# Patient Record
Sex: Male | Born: 1940 | Race: Black or African American | Hispanic: No | Marital: Married | Smoking: Never smoker
Health system: Southern US, Community
[De-identification: ages and names within clinical notes are randomized; demographics above are authoritative.]

## PROBLEM LIST (undated history)

## (undated) DIAGNOSIS — E039 Hypothyroidism, unspecified: Secondary | ICD-10-CM

## (undated) DIAGNOSIS — M1990 Unspecified osteoarthritis, unspecified site: Secondary | ICD-10-CM

## (undated) DIAGNOSIS — R7303 Prediabetes: Secondary | ICD-10-CM

## (undated) DIAGNOSIS — E785 Hyperlipidemia, unspecified: Secondary | ICD-10-CM

## (undated) DIAGNOSIS — I2510 Atherosclerotic heart disease of native coronary artery without angina pectoris: Secondary | ICD-10-CM

## (undated) HISTORY — PX: BACK SURGERY: SHX140

## (undated) HISTORY — DX: Atherosclerotic heart disease of native coronary artery without angina pectoris: I25.10

## (undated) HISTORY — PX: EYE SURGERY: SHX253

## (undated) HISTORY — DX: Hyperlipidemia, unspecified: E78.5

## (undated) HISTORY — DX: Hypothyroidism, unspecified: E03.9

## (undated) HISTORY — PX: TONSILLECTOMY: SUR1361

---

## 1998-10-17 ENCOUNTER — Ambulatory Visit (HOSPITAL_COMMUNITY): Admission: RE | Admit: 1998-10-17 | Discharge: 1998-10-17 | Payer: Self-pay | Admitting: Internal Medicine

## 2002-02-08 ENCOUNTER — Ambulatory Visit (HOSPITAL_COMMUNITY): Admission: RE | Admit: 2002-02-08 | Discharge: 2002-02-08 | Payer: Self-pay | Admitting: Internal Medicine

## 2002-02-08 ENCOUNTER — Encounter: Payer: Self-pay | Admitting: Internal Medicine

## 2003-09-20 ENCOUNTER — Ambulatory Visit (HOSPITAL_COMMUNITY): Admission: RE | Admit: 2003-09-20 | Discharge: 2003-09-20 | Payer: Self-pay | Admitting: Internal Medicine

## 2005-04-20 ENCOUNTER — Ambulatory Visit (HOSPITAL_COMMUNITY): Admission: RE | Admit: 2005-04-20 | Discharge: 2005-04-20 | Payer: Self-pay | Admitting: Internal Medicine

## 2006-07-02 ENCOUNTER — Emergency Department (HOSPITAL_COMMUNITY): Admission: EM | Admit: 2006-07-02 | Discharge: 2006-07-02 | Payer: Self-pay | Admitting: Emergency Medicine

## 2006-07-22 ENCOUNTER — Encounter (INDEPENDENT_AMBULATORY_CARE_PROVIDER_SITE_OTHER): Payer: Self-pay | Admitting: Cardiology

## 2006-07-22 ENCOUNTER — Ambulatory Visit (HOSPITAL_COMMUNITY): Admission: RE | Admit: 2006-07-22 | Discharge: 2006-07-22 | Payer: Self-pay | Admitting: *Deleted

## 2008-01-06 ENCOUNTER — Emergency Department (HOSPITAL_COMMUNITY): Admission: EM | Admit: 2008-01-06 | Discharge: 2008-01-06 | Payer: Self-pay | Admitting: Emergency Medicine

## 2010-09-12 ENCOUNTER — Ambulatory Visit (HOSPITAL_COMMUNITY): Admission: RE | Admit: 2010-09-12 | Discharge: 2010-09-12 | Payer: Self-pay | Admitting: Internal Medicine

## 2010-09-20 ENCOUNTER — Ambulatory Visit (HOSPITAL_COMMUNITY): Admission: RE | Admit: 2010-09-20 | Discharge: 2010-09-20 | Payer: Self-pay | Admitting: Internal Medicine

## 2010-10-27 ENCOUNTER — Encounter
Admission: RE | Admit: 2010-10-27 | Discharge: 2010-10-27 | Payer: Self-pay | Source: Home / Self Care | Attending: Orthopedic Surgery | Admitting: Orthopedic Surgery

## 2010-11-01 ENCOUNTER — Ambulatory Visit (HOSPITAL_COMMUNITY)
Admission: RE | Admit: 2010-11-01 | Discharge: 2010-11-01 | Payer: Self-pay | Source: Home / Self Care | Attending: Orthopedic Surgery | Admitting: Orthopedic Surgery

## 2010-11-29 ENCOUNTER — Other Ambulatory Visit (HOSPITAL_COMMUNITY): Payer: Self-pay | Admitting: Family Medicine

## 2010-11-29 DIAGNOSIS — R911 Solitary pulmonary nodule: Secondary | ICD-10-CM

## 2010-12-21 ENCOUNTER — Other Ambulatory Visit (HOSPITAL_COMMUNITY): Payer: Self-pay | Admitting: Family Medicine

## 2010-12-21 ENCOUNTER — Ambulatory Visit (HOSPITAL_COMMUNITY): Admission: RE | Admit: 2010-12-21 | Payer: BC Managed Care – PPO | Source: Ambulatory Visit

## 2010-12-21 ENCOUNTER — Ambulatory Visit (HOSPITAL_COMMUNITY)
Admission: RE | Admit: 2010-12-21 | Discharge: 2010-12-21 | Disposition: A | Discharge status: Home / Self Care | Payer: BC Managed Care – PPO | Source: Ambulatory Visit | Attending: Family Medicine | Admitting: Family Medicine

## 2010-12-21 ENCOUNTER — Encounter (HOSPITAL_COMMUNITY): Payer: Self-pay

## 2010-12-21 DIAGNOSIS — Z09 Encounter for follow-up examination after completed treatment for conditions other than malignant neoplasm: Secondary | ICD-10-CM | POA: Insufficient documentation

## 2010-12-21 DIAGNOSIS — K7689 Other specified diseases of liver: Secondary | ICD-10-CM | POA: Insufficient documentation

## 2010-12-21 DIAGNOSIS — R911 Solitary pulmonary nodule: Secondary | ICD-10-CM

## 2010-12-21 DIAGNOSIS — J984 Other disorders of lung: Secondary | ICD-10-CM | POA: Insufficient documentation

## 2010-12-21 DIAGNOSIS — K769 Liver disease, unspecified: Secondary | ICD-10-CM

## 2010-12-21 DIAGNOSIS — Q619 Cystic kidney disease, unspecified: Secondary | ICD-10-CM | POA: Insufficient documentation

## 2010-12-21 MED ORDER — IOHEXOL 300 MG/ML  SOLN
100.0000 mL | Freq: Once | INTRAMUSCULAR | Status: AC | PRN
  Administered 2010-12-21: 100 mL via INTRAVENOUS

## 2011-06-27 ENCOUNTER — Other Ambulatory Visit (HOSPITAL_COMMUNITY): Payer: Self-pay | Admitting: Family Medicine

## 2011-06-27 DIAGNOSIS — R911 Solitary pulmonary nodule: Secondary | ICD-10-CM

## 2011-06-28 ENCOUNTER — Other Ambulatory Visit (HOSPITAL_COMMUNITY): Payer: Medicare Other

## 2011-06-28 ENCOUNTER — Ambulatory Visit (HOSPITAL_COMMUNITY)
Admission: RE | Admit: 2011-06-28 | Discharge: 2011-06-28 | Disposition: A | Discharge status: Home / Self Care | Payer: BC Managed Care – PPO | Source: Ambulatory Visit | Attending: Family Medicine | Admitting: Family Medicine

## 2011-06-28 DIAGNOSIS — J984 Other disorders of lung: Secondary | ICD-10-CM | POA: Insufficient documentation

## 2011-06-28 DIAGNOSIS — R911 Solitary pulmonary nodule: Secondary | ICD-10-CM

## 2011-06-28 DIAGNOSIS — Z09 Encounter for follow-up examination after completed treatment for conditions other than malignant neoplasm: Secondary | ICD-10-CM | POA: Insufficient documentation

## 2011-08-02 LAB — I-STAT 8, (EC8 V) (CONVERTED LAB)
BUN: 11
Bicarbonate: 24.9 — ABNORMAL HIGH
Chloride: 104
Glucose, Bld: 117 — ABNORMAL HIGH
HCT: 49
Hemoglobin: 16.7
Operator id: 234501
Potassium: 4.2
Sodium: 135
TCO2: 26
pCO2, Ven: 42.1 — ABNORMAL LOW
pH, Ven: 7.38 — ABNORMAL HIGH

## 2011-08-02 LAB — POCT I-STAT CREATININE
Creatinine, Ser: 1.3
Operator id: 234501

## 2011-11-28 ENCOUNTER — Ambulatory Visit
Admission: RE | Admit: 2011-11-28 | Discharge: 2011-11-28 | Disposition: A | Discharge status: Home / Self Care | Payer: BC Managed Care – PPO | Source: Ambulatory Visit | Attending: Family Medicine | Admitting: Family Medicine

## 2011-11-28 ENCOUNTER — Other Ambulatory Visit: Payer: Self-pay | Admitting: Family Medicine

## 2011-11-28 MED ORDER — IOHEXOL 300 MG/ML  SOLN
100.0000 mL | Freq: Once | INTRAMUSCULAR | Status: AC | PRN
  Administered 2011-11-28: 100 mL via INTRAVENOUS

## 2012-03-18 ENCOUNTER — Encounter: Payer: Self-pay | Admitting: Pulmonary Disease

## 2012-03-18 ENCOUNTER — Ambulatory Visit (INDEPENDENT_AMBULATORY_CARE_PROVIDER_SITE_OTHER): Payer: Medicare Other | Admitting: Pulmonary Disease

## 2012-03-18 VITALS — BP 112/64 | HR 59 | Temp 98.1°F | Wt 176.6 lb

## 2012-03-18 DIAGNOSIS — R059 Cough, unspecified: Secondary | ICD-10-CM

## 2012-03-18 DIAGNOSIS — R911 Solitary pulmonary nodule: Secondary | ICD-10-CM

## 2012-03-18 DIAGNOSIS — R05 Cough: Secondary | ICD-10-CM | POA: Insufficient documentation

## 2012-03-18 DIAGNOSIS — R054 Cough syncope: Secondary | ICD-10-CM

## 2012-03-18 DIAGNOSIS — R053 Chronic cough: Secondary | ICD-10-CM

## 2012-03-18 DIAGNOSIS — R0982 Postnasal drip: Secondary | ICD-10-CM

## 2012-03-18 DIAGNOSIS — K219 Gastro-esophageal reflux disease without esophagitis: Secondary | ICD-10-CM | POA: Insufficient documentation

## 2012-03-18 MED ORDER — ESOMEPRAZOLE MAGNESIUM 40 MG PO PACK: 40.0000 mg | PACK | Freq: Every day | ORAL | Status: DC

## 2012-03-18 MED ORDER — FLUTICASONE PROPIONATE 50 MCG/ACT NA SUSP: 2.0000 | Freq: Every day | NASAL | Status: DC

## 2012-03-18 MED ORDER — DIPHENHYDRAMINE HCL 50 MG PO CAPS: ORAL_CAPSULE | ORAL | Status: DC

## 2012-03-18 MED ORDER — TRAMADOL HCL 50 MG PO TABS: 50.0000 mg | ORAL_TABLET | Freq: Three times a day (TID) | ORAL | Status: AC | PRN

## 2012-03-18 NOTE — Progress Notes (Signed)
Chief Complaint  Patient presents with  . Advice Only    Pt c/o chronic cough x sept 2012. Sometimes he coughs so much he passes out.    History of Present Illness: Stephen Garcia is a 71 y.o. male for evaluation of chronic cough.  He has noticed intermittent trouble with a cough for some time.  Things got worse when he had a cold in September 2012.  His cough has become more frequent since.  He can get into coughing spells, and then pass out for about 10 seconds.  These have occurred several times in the past few months.  He has sinus congestion and a runny nose.  He has post-nasal drip.  He denies sinus pressure or headaches.  He will occasionally feel congestion and popping in his ears.  He has been using vicks nasal spray and this helps.  He has noticed that these symptoms get worse around fresh cut grass, and that he will get hoarse when this happens.  He has a constant scratchy, tickle feeling in his throat.  He does not usually bring up sputum.  He does not have trouble swallowing.  His cough will get worse if he laughs, talks for long stretches, or takes deep breaths.  There is no prior history of asthma.  He gets occasional wheeze, but this comes from his throat.  He denies chest tightness or dyspnea with exertion.  He was tried on advair, but felt this made his symptoms worse.  He does get episodes of heartburn, depending on which types of food he eats.  He has notice his cough is worse when he lays flat.  He was given a sample of nexium, and felt that his cough was improved when he took this.  He denies gland swelling, or skin rash.  He tried smoking for a brief period in his 22's.  He is from [place], but has lived in [place] since 1963.  He worked for [employer] system in [place], and retired in 2011.  He does not have any recent travel or sick exposures.  He does not have any animal exposures.  There is no history of pneumonia, or exposure to tuberculosis.  Chest  xray report from November 11, 2011 showed no acute cardiopulmonary process.  He has been followed with serial CT chest since 2011 for pleural based left lower lobe nodule.  This has been stable, and there was no other lung pathology noted.   Past Medical History  Diagnosis Date  . Hyperlipidemia   . Hypothyroidism   . Glaucoma     Past Surgical History  Procedure Date  . Back surgery   . Tonsillectomy     Current Outpatient Prescriptions on File Prior to Visit  Medication Sig Dispense Refill  . Atorvastatin Calcium (LIPITOR PO) Take 1 tablet by mouth daily.      . Levothyroxine Sodium (SYNTHROID PO) Take 1 tablet by mouth daily.        No Known Allergies  family history includes Asthma in his sister and Heart disease in his mother.   reports that he has never smoked. He does not have any smokeless tobacco history on file. He reports that he does not drink alcohol or use illicit drugs.  Review of Systems  Constitutional: Negative for fever and appetite change.  HENT: Negative for ear pain, congestion, sore throat, sneezing, trouble swallowing and dental problem.   Respiratory: Positive for cough. Negative for shortness of breath.   Cardiovascular: Negative  for chest pain, palpitations and leg swelling.  Gastrointestinal: Negative for abdominal pain.  Musculoskeletal: Positive for arthralgias. Negative for joint swelling.  Skin: Negative for color change and rash.  Neurological: Positive for headaches.  Psychiatric/Behavioral: Negative for dysphoric mood. The patient is not nervous/anxious.     Physical Exam: BP 112/64  Pulse 59  Temp(Src) 98.1 F (36.7 C) (Oral)  Wt 176 lb 9.6 oz (80.105 kg)  SpO2 98% There is no height on file to calculate BMI.  General - Obese HEENT - PERRLA, EOMI, wears glasses, no sinus tenderness, clear nasal discharge, MP 3, mild erythema of posterior pharynx, no oral exudate, no LAN, TM clear Cardiac - s1s2 regular, no murmur Chest - good  air entry, no wheeze/rales/dullness Abdomen - soft, non-tender, no organomegaly Extremities - no e/c/c Neurologic - normal strength, CN intact Skin - no rashes Psychiatric - normal mood, behavior  Assessment/Plan:  Outpatient Encounter Prescriptions as of 03/18/2012  Medication Sig Dispense Refill  . Atorvastatin Calcium (LIPITOR PO) Take 1 tablet by mouth daily.      . bimatoprost (LUMIGAN) 0.03 % ophthalmic solution Place 1 drop into both eyes daily.      . dorzolamide-timolol (COSOPT) 22.3-6.8 MG/ML ophthalmic solution Place 1 drop into both eyes 2 (two) times daily.      . Levothyroxine Sodium (SYNTHROID PO) Take 1 tablet by mouth daily.        Keigan Girten Pager:  [phone] 03/18/2012, 4:25 PM

## 2012-03-18 NOTE — Assessment & Plan Note (Signed)
He reports improvement in his cough after trial of nexium.  Will refill this.  Explained proper timing for use of nexium.  He may need further evaluation by GI.

## 2012-03-18 NOTE — Assessment & Plan Note (Signed)
He has pleural based left lower lobe nodule first detected in 2011.  This has been stable on radiographic follow up through January 2013.

## 2012-03-18 NOTE — Assessment & Plan Note (Signed)
Will have him use nasal irrigation and flonase on a regular basis.  He can use benadryl 50 mg nightly for one week, then as needed.  Depending on his response he may need further evaluation by ENT.

## 2012-03-18 NOTE — Patient Instructions (Signed)
Nasal irrigation (saline nasal spray) once daily>>use before using flonase Flonase two sprays each nostril daily Benadryl 50 mg pill>>take one pill before bedtime for one week, then as needed for sinus congestion or allergies Salt water gargle once or twice per day Sip water when you have urge to cough Use sugarless candy to keep your mouth moist Tramadol 50 mg three times per day as needed to suppress cough Nexium 40 mg pill>>take 30 minutes before first meal of the day Will schedule breathing test (PFT) Follow up in 3 to 4 weeks

## 2012-03-18 NOTE — Progress Notes (Deleted)
  Subjective:    Patient ID: Stephen Garcia, male    DOB: 1941-09-20, 71 y.o.   MRN: [id number]  HPI    Review of Systems  Constitutional: Negative for fever and appetite change.  HENT: Negative for ear pain, congestion, sore throat, sneezing, trouble swallowing and dental problem.   Respiratory: Positive for cough. Negative for shortness of breath.   Cardiovascular: Negative for chest pain, palpitations and leg swelling.  Gastrointestinal: Negative for abdominal pain.  Musculoskeletal: Positive for arthralgias. Negative for joint swelling.  Skin: Negative for color change and rash.  Neurological: Positive for headaches.  Psychiatric/Behavioral: Negative for dysphoric mood. The patient is not nervous/anxious.        Objective:   Physical Exam        Assessment & Plan:

## 2012-03-18 NOTE — Assessment & Plan Note (Signed)
He has cough present for several months. There is no history of smoking, and he has essentially normal chest imaging studies.  I have explained that in this scenario chronic cough is most often related to post-nasal drip, asthma, and/or reflux.

## 2012-03-18 NOTE — Assessment & Plan Note (Addendum)
Will arrange for pulmonary function test to further assess for asthma.  However, clinical suspicion for asthma is low.  Will therefore defer starting inhaler therapy for now.  He can use tramadol 50 mg as needed to help suppress his cough.

## 2012-03-19 ENCOUNTER — Telehealth: Payer: Self-pay | Admitting: Pulmonary Disease

## 2012-03-19 NOTE — Telephone Encounter (Signed)
Spoke with pt. He states needs PA for nexium 40 mg daily. Called [facility] and requested that they fax PA request to [phone].  Will await fax

## 2012-03-23 NOTE — Telephone Encounter (Signed)
Pt called back.  Needs nexium.  Has not heard back from [place] yet. Stephen Garcia

## 2012-03-23 NOTE — Telephone Encounter (Signed)
I spoke with [facility] and the insurance is denying the nexium. Pt must first try and fail omeprazole and protonix. Please advise Dr. Craige Cotta, thanks

## 2012-03-24 MED ORDER — OMEPRAZOLE 40 MG PO CPDR: 40.0000 mg | DELAYED_RELEASE_CAPSULE | Freq: Every day | ORAL | Status: DC

## 2012-03-24 NOTE — Telephone Encounter (Signed)
Okay to send script for omeprazole 40 mg daily.  Dispense 30 pills with 3 refills.

## 2012-03-24 NOTE — Telephone Encounter (Signed)
lmomtcb x1--rx has been sent for omperazole 40 mg

## 2012-03-25 NOTE — Telephone Encounter (Signed)
Called, spoke with pt.  I informed him of below.  He verbalized understanding of this and stated he picked this up yesterday.  Advised to call back if he has problems with this need medication.  He verbalized understanding.

## 2012-04-15 ENCOUNTER — Ambulatory Visit (INDEPENDENT_AMBULATORY_CARE_PROVIDER_SITE_OTHER): Payer: Medicare Other | Admitting: Pulmonary Disease

## 2012-04-15 ENCOUNTER — Ambulatory Visit (INDEPENDENT_AMBULATORY_CARE_PROVIDER_SITE_OTHER): Payer: BC Managed Care – PPO | Admitting: Pulmonary Disease

## 2012-04-15 ENCOUNTER — Encounter: Payer: Self-pay | Admitting: Pulmonary Disease

## 2012-04-15 VITALS — BP 110/70 | HR 63 | Temp 97.5°F | Ht 66.0 in | Wt 175.0 lb

## 2012-04-15 DIAGNOSIS — K219 Gastro-esophageal reflux disease without esophagitis: Secondary | ICD-10-CM

## 2012-04-15 DIAGNOSIS — R0982 Postnasal drip: Secondary | ICD-10-CM

## 2012-04-15 DIAGNOSIS — R05 Cough: Secondary | ICD-10-CM

## 2012-04-15 LAB — PULMONARY FUNCTION TEST

## 2012-04-15 NOTE — Progress Notes (Signed)
PFT done today. 

## 2012-04-15 NOTE — Assessment & Plan Note (Signed)
He is to resume flonase, and continue nasal irrigation.  He can use prn benadryl at night.

## 2012-04-15 NOTE — Patient Instructions (Addendum)
Flonase two spray daily Nasal irrigation (saline nasal spray) daily Nexium 40 mg pill daily 30 minutes before first meal of the day Can use benadryl 50 mg at night as needed for allergies or sinus congestion Follow up in 2 months

## 2012-04-15 NOTE — Assessment & Plan Note (Signed)
He is to continue nexium. 

## 2012-04-15 NOTE — Assessment & Plan Note (Signed)
No further recurrent since last visit.

## 2012-04-15 NOTE — Progress Notes (Signed)
Chief Complaint  Patient presents with  . Follow-up    w/ pft. Pt states his breathing is okay--cough is getting better but still has occasional coughing spells, some wheezing and chest tx.     History of Present Illness: Stephen Garcia is a 71 y.o. male never smoker for evaluation of chronic cough, post-nasal drip, and reflux.  His cough was doing better until 3 days ago.  He is getting some hoarseness.  He is not sure if tramadol helped much.   He was not sure if flonase was working.  He got a new script yesterday.  Nasal irrigation and nexium have helped.  He has not had syncope episode since last visit.   Past Medical History  Diagnosis Date  . Hyperlipidemia   . Hypothyroidism   . Glaucoma     Past Surgical History  Procedure Date  . Back surgery   . Tonsillectomy    No Known Allergies   Physical Exam: Filed Vitals:   04/15/12 1051 04/15/12 1052  BP:  110/70  Pulse:  63  Temp: 97.5 F (36.4 C)   TempSrc: Oral   Height: 5\' 6"  (1.676 m)   Weight: 175 lb (79.379 kg)   SpO2:  98%  ,  Current Encounter SPO2  04/15/12 1052 98%  03/18/12 1615 98%   Wt Readings from Last 3 Encounters:  04/15/12 175 lb (79.379 kg)  03/18/12 176 lb 9.6 oz (80.105 kg)   Body mass index is 28.25 kg/(m^2).  General - No distress ENT - Ears normal. Throat and pharynx normal. Neck supple. No adenopathy or masses in the neck or supraclavicular regions. Nasal septum midline, no deformities, nares patent, normal mucosa without swelling, no polyps, no bleeding. Paranasal sinuses are normal. No tenderness to the maxillary, frontal, ethmoids or mastoids. Cardiac - S1 and S2 normal, no murmurs, clicks, gallops or rubs. Regular rate and rhythm.  No edema or JVD. Chest - Chest is clear, no wheezing or rales. Normal symmetric air entry throughout both lung fields. No chest wall deformities or tenderness. Back - Cervical, thoracic and lumbar spine exam is normal without tenderness, masses or  kyphoscoliosis. Full range of motion without pain is noted. Abd - soft without tenderness, guarding, mass, rebound or organomegaly. Bowel sounds are normal. No CVA tenderness noted. Ext - good pulses, motor strength and sensation. Neuro - Cranial nerves are normal. PERLA. EOM's intact. Skin - no discernible active dermatitis, erythema, urticaria or inflammatory process. Psych - normal mood, and behavior.  PFT 04/15/12>>FEV1 2.29(91%), FEV1% 81, TLC 4.59(83%), DLCO 65%, no BD.  Assessment/Plan:  Coralyn Helling, MD [facility] Pager:  [phone] 04/15/2012, 11:20 AM  Patient Instructions  Flonase two spray daily Nasal irrigation (saline nasal spray) daily Nexium 40 mg pill daily 30 minutes before first meal of the day Can use benadryl 50 mg at night as needed for allergies or sinus congestion Follow up in 2 months

## 2012-04-15 NOTE — Assessment & Plan Note (Signed)
Likely from post nasal drip, and reflux.

## 2012-06-22 ENCOUNTER — Ambulatory Visit: Payer: BC Managed Care – PPO | Admitting: Pulmonary Disease

## 2012-10-20 ENCOUNTER — Other Ambulatory Visit: Payer: Self-pay | Admitting: Family Medicine

## 2012-10-20 DIAGNOSIS — R911 Solitary pulmonary nodule: Secondary | ICD-10-CM

## 2012-10-27 ENCOUNTER — Ambulatory Visit
Admission: RE | Admit: 2012-10-27 | Discharge: 2012-10-27 | Disposition: A | Discharge status: Home / Self Care | Payer: BC Managed Care – PPO | Source: Ambulatory Visit | Attending: Family Medicine | Admitting: Family Medicine

## 2012-10-27 DIAGNOSIS — R911 Solitary pulmonary nodule: Secondary | ICD-10-CM

## 2013-05-20 ENCOUNTER — Telehealth: Payer: Self-pay | Admitting: Endocrinology

## 2013-05-20 NOTE — Telephone Encounter (Signed)
Spoke with pt regarding his medical records release, he will call to schedule appt at a later date.

## 2013-09-08 ENCOUNTER — Other Ambulatory Visit: Payer: Self-pay | Admitting: *Deleted

## 2013-09-10 ENCOUNTER — Encounter: Payer: Self-pay | Admitting: Endocrinology

## 2013-09-10 ENCOUNTER — Ambulatory Visit (INDEPENDENT_AMBULATORY_CARE_PROVIDER_SITE_OTHER): Payer: Medicare Other | Admitting: Endocrinology

## 2013-09-10 ENCOUNTER — Other Ambulatory Visit: Payer: Self-pay | Admitting: *Deleted

## 2013-09-10 VITALS — BP 126/72 | HR 77 | Temp 98.6°F | Resp 12 | Ht 65.0 in | Wt 189.0 lb

## 2013-09-10 DIAGNOSIS — E039 Hypothyroidism, unspecified: Secondary | ICD-10-CM

## 2013-09-10 DIAGNOSIS — M810 Age-related osteoporosis without current pathological fracture: Secondary | ICD-10-CM

## 2013-09-10 NOTE — Progress Notes (Signed)
Patient ID: Stephen Garcia, male   DOB: 1941/10/17, 72 y.o.   MRN: [id number]  Reason for Appointment:  Hypothyroidism and osteoporosis, followup visit    History of Present Illness:   The hypothyroidism was first diagnosed  several years ago Unclear what his initial symptoms at diagnosis were Over the last several years he has been requiring lower doses of thyroxine supplementation and his TSH has been persistently low until 5/14 when it was 0.63 Complaints are reported by the patient now are none, no unusual fatigue or cold intolerance                Compliance with the medical regimen has been as prescribed with taking the tablet in the morning before breakfast.  2. He has known vertebral compression fractures which are symptomatic. Also has had lumbar disease and will have occasional low back pain which is not any worse. He has been found to have mild osteoporosis.  Because of his osteoporosis and history of fractures he has been started on Fosamax weekly and he is tolerating this well  3. Mildly symptomatic hypogonadism: He has had low testosterone levels which are minimally below the normal limits and previously tried on supplementation by another physician without any subjective improvement Total testosterone was 386 in 5/14 with normal prolactin      Medication List       This list is accurate as of: 09/10/13 10:40 AM.  Always use your most recent med list.               alendronate 70 MG tablet  Commonly known as:  FOSAMAX     bimatoprost 0.03 % ophthalmic solution  Commonly known as:  LUMIGAN  Place 1 drop into both eyes daily.     diphenhydrAMINE 50 MG capsule  Commonly known as:  BENADRYL  One pill before bedtime for one week, then as needed for allergies     ergocalciferol 50000 UNITS capsule  Commonly known as:  VITAMIN D2  Take 50,000 Units by mouth once a week.     LIPITOR PO  Take 1 tablet by mouth daily.     omeprazole 40 MG capsule  Commonly known as:   PRILOSEC  Take 1 capsule (40 mg total) by mouth daily.     SYNTHROID PO  Take 1 tablet by mouth daily.        Allergies: No Known Allergies  Past Medical History  Diagnosis Date  . Hyperlipidemia   . Hypothyroidism   . Glaucoma     Past Surgical History  Procedure Laterality Date  . Back surgery    . Tonsillectomy      Family History  Problem Relation Age of Onset  . Heart disease Mother   . Asthma Sister     Social History:  reports that he has never smoked. He does not have any smokeless tobacco history on file. He reports that he does not drink alcohol or use illicit drugs.  REVIEW Of SYSTEMS:   Examination:   BP 126/72  Pulse 77  Temp(Src) 98.6 F (37 C)  Resp 12  Ht 5\' 5"  (1.651 m)  Wt 189 lb (85.73 kg)  BMI 31.45 kg/m2  SpO2 97%   GENERAL APPEARANCE:  well-built well-nourished, well-looking   FACE: No puffiness of face, feet or hands      NECK: no thyromegaly.          NEUROLOGIC EXAM: DTRs 2+ bilaterally at biceps with normal relaxation. Examination of the  lumbar spine shows no tenderness or prominent spines, has loss of lordosis    Assessments   Hypothyroidism: labs pending Osteoporosis.The current treatment with Fosamax is tolerated well;  continue this for about 5 years, discussed  Low T: He is asymptomatic and does not require treatment   He will followup in one year and continue following up or other problems with PCP  [facility] 09/10/2013, 10:40 AM

## 2013-09-14 ENCOUNTER — Other Ambulatory Visit: Payer: Self-pay | Admitting: *Deleted

## 2013-09-14 ENCOUNTER — Telehealth: Payer: Self-pay | Admitting: Endocrinology

## 2013-09-14 MED ORDER — ALENDRONATE SODIUM 70 MG PO TABS: 70.0000 mg | ORAL_TABLET | ORAL | Status: DC

## 2013-09-14 NOTE — Telephone Encounter (Signed)
rx sent

## 2014-03-31 ENCOUNTER — Other Ambulatory Visit: Payer: Self-pay | Admitting: *Deleted

## 2014-03-31 ENCOUNTER — Telehealth: Payer: Self-pay | Admitting: *Deleted

## 2014-03-31 MED ORDER — ALENDRONATE SODIUM 70 MG PO TABS: 70.0000 mg | ORAL_TABLET | ORAL | Status: DC

## 2014-03-31 NOTE — Telephone Encounter (Signed)
Medication issue didn't want to tell me what

## 2014-06-17 ENCOUNTER — Other Ambulatory Visit: Payer: Self-pay | Admitting: *Deleted

## 2014-06-17 DIAGNOSIS — E039 Hypothyroidism, unspecified: Secondary | ICD-10-CM

## 2014-06-17 DIAGNOSIS — M810 Age-related osteoporosis without current pathological fracture: Secondary | ICD-10-CM

## 2014-06-20 ENCOUNTER — Other Ambulatory Visit: Payer: Self-pay | Admitting: *Deleted

## 2014-06-20 DIAGNOSIS — M810 Age-related osteoporosis without current pathological fracture: Secondary | ICD-10-CM

## 2014-07-04 ENCOUNTER — Other Ambulatory Visit: Payer: Self-pay | Admitting: Endocrinology

## 2014-09-16 ENCOUNTER — Other Ambulatory Visit: Payer: Self-pay | Admitting: *Deleted

## 2014-09-16 ENCOUNTER — Other Ambulatory Visit (INDEPENDENT_AMBULATORY_CARE_PROVIDER_SITE_OTHER): Payer: Medicare Other

## 2014-09-16 ENCOUNTER — Ambulatory Visit (INDEPENDENT_AMBULATORY_CARE_PROVIDER_SITE_OTHER): Payer: Medicare Other | Admitting: Endocrinology

## 2014-09-16 ENCOUNTER — Encounter: Payer: Self-pay | Admitting: Endocrinology

## 2014-09-16 VITALS — BP 126/68 | HR 68 | Temp 97.9°F | Resp 16 | Ht 63.25 in | Wt 186.2 lb

## 2014-09-16 DIAGNOSIS — E039 Hypothyroidism, unspecified: Secondary | ICD-10-CM

## 2014-09-16 DIAGNOSIS — M810 Age-related osteoporosis without current pathological fracture: Secondary | ICD-10-CM

## 2014-09-16 LAB — T4, FREE: Free T4: 0.72 ng/dL (ref 0.60–1.60)

## 2014-09-16 LAB — RENAL FUNCTION PANEL
ALBUMIN: 3.8 g/dL (ref 3.5–5.2)
BUN: 11 mg/dL (ref 6–23)
CALCIUM: 9.6 mg/dL (ref 8.4–10.5)
CO2: 26 mEq/L (ref 19–32)
Chloride: 104 mEq/L (ref 96–112)
Creatinine, Ser: 1.2 mg/dL (ref 0.4–1.5)
GFR: 78.97 mL/min (ref >60.00)
GLUCOSE: 112 mg/dL — AB (ref 70–99)
POTASSIUM: 3.7 meq/L (ref 3.5–5.1)
Phosphorus: 3.2 mg/dL (ref 2.3–4.6)
Sodium: 138 mEq/L (ref 135–145)

## 2014-09-16 LAB — VITAMIN D 25 HYDROXY (VIT D DEFICIENCY, FRACTURES): VITD: 43.01 ng/mL (ref 30.00–100.00)

## 2014-09-16 LAB — TSH: TSH: 0.42 u[IU]/mL (ref 0.35–4.50)

## 2014-09-16 MED ORDER — ALENDRONATE SODIUM 70 MG PO TABS: 70.0000 mg | ORAL_TABLET | ORAL | Status: DC

## 2014-09-16 NOTE — Progress Notes (Addendum)
Patient ID: Stephen Garcia, male   DOB: 07/09/1941, 73 y.o.   MRN: [id number]  Reason for Appointment:  Hypothyroidism and osteoporosis, followup visit    History of Present Illness:   The hypothyroidism was first diagnosed  several years ago Unclear what his initial symptoms at diagnosis were Over the last several years he has been requiring lower doses of thyroxine supplementation and his TSH has been persistently low until 5/14 when it was 0.63  Complaints are reported by the patient now are none, no unusual fatigue or cold intolerance                Currently taking 75 g Compliance with the medical regimen has been as prescribed with taking the tablet in the morning before breakfast.  Last TSH done by PCP in 12/02 was normal  No results found for: FREET4, TSH   OSTEOPOROSIS: He has known vertebral compression fracture of L2 on his MRI which are symptomatic. Initial lumbar compression fractures of L1, L2 were noticed in 2011 on a routine x-ray  Also has had lumbar disease and has had chronic low back pain which is still present.  He has a bone density done previously indicating mild osteoporosis. Last T score was -3.0, actual report is not available His last height was 5 feet 3.5  Because of his osteoporosis and history of fractures he has been on Fosamax weekly and he is tolerating this well  Vitamin D supplementation: He is taking 50,000 units weekly on every Sunday, recent level not available       Medication List       This list is accurate as of: 09/16/14 11:17 AM.  Always use your most recent med list.               alendronate 70 MG tablet  Commonly known as:  FOSAMAX  Take 1 tablet (70 mg total) by mouth once a week.     bimatoprost 0.03 % ophthalmic solution  Commonly known as:  LUMIGAN  Place 1 drop into both eyes daily.     diphenhydrAMINE 50 MG capsule  Commonly known as:  BENADRYL  One pill before bedtime for one week, then as needed for allergies      dorzolamide-timolol 22.3-6.8 MG/ML ophthalmic solution  Commonly known as:  COSOPT     ergocalciferol 50000 UNITS capsule  Commonly known as:  VITAMIN D2  Take 50,000 Units by mouth once a week.     LIPITOR PO  Take 1 tablet by mouth daily.     SYNTHROID PO  Take 1 tablet by mouth daily.        Allergies: No Known Allergies  Past Medical History  Diagnosis Date  . Hyperlipidemia   . Hypothyroidism   . Glaucoma     Past Surgical History  Procedure Laterality Date  . Back surgery    . Tonsillectomy      Family History  Problem Relation Age of Onset  . Heart disease Mother   . Asthma Sister     Social History:  reports that he has never smoked. He does not have any smokeless tobacco history on file. He reports that he does not drink alcohol or use illicit drugs.  REVIEW Of SYSTEMS:  No history of hypertension  Hypogonadism ? He had low testosterone levels which are minimally below the normal limits and previously tried on supplementation by another physician without any subjective improvement Total testosterone was 386 in 5/14 with normal prolactin  Examination:   BP 126/68 mmHg  Pulse 68  Temp(Src) 97.9 F (36.6 C)  Resp 16  Ht 5\' 5"  (1.651 m)  Wt 186 lb 3.2 oz (84.46 kg)  BMI 30.99 kg/m2  SpO2 97%   GENERAL APPEARANCE:  well-built, well-looking, normal posture     NECK: no thyromegaly.          NEUROLOGIC EXAM: DTRs 2+ bilaterally at biceps with normal relaxation. Examination of the spine shows no tenderness or prominent spines, has loss of lumbar lordosis Minimal lower thoracic kyphosis    Assessments   Hypothyroidism taking 75 g supplement: labs pending.  Will adjust his dosage based on the TSH  Osteoporosis. This is associated with asymptomatic vertebral fracture; the treatment with Fosamax is tolerated well;   He has no height loss since last year He needs to continue Fosamax for a total of at least 5 years which will be in 2019,  discussed  Also needs vitamin D level assessment to adjust his prescription   He will followup in one year and continue following up or other problems with PCP  [facility],Stephen Garcia 09/16/2014, 11:17 AM   Addendum based on labs:  the changes needed are: reduce Vitamin D to 2x per month only; thyroid pill 6 days a week only  Appointment on 09/16/2014  Component Date Value Ref Range Status  . VITD 09/16/2014 43.01  30.00 - 100.00 ng/mL Final  . Free T4 09/16/2014 0.72  0.60 - 1.60 ng/dL Final  . TSH 54/09/811911/04/2014 0.42  0.35 - 4.50 uIU/mL Final  . Sodium 09/16/2014 138  135 - 145 mEq/L Final  . Potassium 09/16/2014 3.7  3.5 - 5.1 mEq/L Final  . Chloride 09/16/2014 104  96 - 112 mEq/L Final  . CO2 09/16/2014 26  19 - 32 mEq/L Final  . Calcium 09/16/2014 9.6  8.4 - 10.5 mg/dL Final  . Albumin 14/78/295611/04/2014 3.8  3.5 - 5.2 g/dL Final  . BUN 21/30/865711/04/2014 11  6 - 23 mg/dL Final  . Creatinine, Ser 09/16/2014 1.2  0.4 - 1.5 mg/dL Final  . Glucose, Bld 84/69/629511/04/2014 112* 70 - 99 mg/dL Final  . Phosphorus 28/41/324411/04/2014 3.2  2.3 - 4.6 mg/dL Final  . GFR 01/02/725311/04/2014 78.97  >60.00 mL/min Final

## 2014-09-20 LAB — VITAMIN D 1,25 DIHYDROXY: Vit D, 1,25-Dihydroxy: 47.9 pg/mL (ref 19.9–79.3)

## 2014-09-23 ENCOUNTER — Telehealth: Payer: Self-pay | Admitting: Endocrinology

## 2014-09-23 NOTE — Telephone Encounter (Signed)
Pt has questions regarding his meds please assist

## 2014-09-27 NOTE — Telephone Encounter (Signed)
Left message for patient to called back,

## 2015-02-28 ENCOUNTER — Other Ambulatory Visit: Payer: Self-pay | Admitting: Endocrinology

## 2015-09-12 ENCOUNTER — Other Ambulatory Visit: Payer: Self-pay | Admitting: Endocrinology

## 2015-09-18 ENCOUNTER — Ambulatory Visit (INDEPENDENT_AMBULATORY_CARE_PROVIDER_SITE_OTHER): Payer: Medicare Other | Admitting: Endocrinology

## 2015-09-18 ENCOUNTER — Telehealth: Payer: Self-pay | Admitting: Endocrinology

## 2015-09-18 ENCOUNTER — Other Ambulatory Visit: Payer: Self-pay | Admitting: *Deleted

## 2015-09-18 ENCOUNTER — Encounter: Payer: Self-pay | Admitting: Endocrinology

## 2015-09-18 VITALS — BP 130/70 | HR 60 | Temp 98.1°F | Resp 14 | Ht 63.25 in | Wt 194.2 lb

## 2015-09-18 DIAGNOSIS — E039 Hypothyroidism, unspecified: Secondary | ICD-10-CM | POA: Diagnosis not present

## 2015-09-18 DIAGNOSIS — M810 Age-related osteoporosis without current pathological fracture: Secondary | ICD-10-CM | POA: Diagnosis not present

## 2015-09-18 LAB — COMPREHENSIVE METABOLIC PANEL
ALBUMIN: 4.2 g/dL (ref 3.5–5.2)
ALT: 20 U/L (ref 0–53)
AST: 23 U/L (ref 0–37)
Alkaline Phosphatase: 36 U/L — ABNORMAL LOW (ref 39–117)
BILIRUBIN TOTAL: 0.5 mg/dL (ref 0.2–1.2)
BUN: 13 mg/dL (ref 6–23)
CO2: 27 mEq/L (ref 19–32)
Calcium: 9.5 mg/dL (ref 8.4–10.5)
Chloride: 102 mEq/L (ref 96–112)
Creatinine, Ser: 1.07 mg/dL (ref 0.40–1.50)
GFR: 86.45 mL/min (ref >60.00)
Glucose, Bld: 92 mg/dL (ref 70–99)
POTASSIUM: 4.1 meq/L (ref 3.5–5.1)
Sodium: 136 mEq/L (ref 135–145)
TOTAL PROTEIN: 7.1 g/dL (ref 6.0–8.3)

## 2015-09-18 LAB — TSH: TSH: 0.57 u[IU]/mL (ref 0.35–4.50)

## 2015-09-18 LAB — T4, FREE: Free T4: 0.51 ng/dL — ABNORMAL LOW (ref 0.60–1.60)

## 2015-09-18 MED ORDER — ALENDRONATE SODIUM 70 MG PO TABS: 70.0000 mg | ORAL_TABLET | ORAL | Status: DC

## 2015-09-18 NOTE — Telephone Encounter (Signed)
rx was sent

## 2015-09-18 NOTE — Telephone Encounter (Signed)
rx sent

## 2015-09-18 NOTE — Progress Notes (Signed)
Patient ID: Stephen Garcia, male   DOB: 03/06/1941, 74 y.o.   MRN: [id number]   Reason for Appointment:  Hypothyroidism and osteoporosis, followup visit  Chief complaint: Back pain   History of Present Illness:   His hypothyroidism was first diagnosed  several years ago Unclear what his initial symptoms at diagnosis were Previously his TSH has been persistently low until 5/14 when it was 0.63 and his dose had been reduced   Complaints are reported by the patient now are none, no unusual fatigue or cold intolerance  However has gained weight                Currently taking 75 g levothyroxine daily Compliance with the medical regimen has been as prescribed with taking the tablet in the morning before breakfast.  Last TSH done in 2015 was normal  Lab Results  Component Value Date   FREET4 0.72 09/16/2014   TSH 0.42 09/16/2014   Weight history:  Wt Readings from Last 3 Encounters:  09/18/15 194 lb 3.2 oz (88.089 kg)  09/16/14 186 lb 3.2 oz (84.46 kg)  09/10/13 189 lb (85.73 kg)     OSTEOPOROSIS: He has known vertebral compression fracture of L2 on his MRI which are symptomatic.  Initial lumbar compression fractures of L1, L2 were noticed in 2011 on a routine x-ray   Also has had lumbar disease and has had chronic low back pain which is a little more recently and mostly on the right side radiating to the upper leg  He has a bone density done previously indicating mild osteoporosis. Last T score was reportedly -3.0 His last height was 5 feet 3.5  Because of his osteoporosis and history of fractures he has been on Fosamax weekly since 2014 and he is tolerating this well  Vitamin D supplementation: He is taking 50,000 units weekly on every Sunday and the level was normal last year       Medication List       This list is accurate as of: 09/18/15 12:50 PM.  Always use your most recent med list.               alendronate 70 MG tablet  Commonly known as:   FOSAMAX  TAKE ONE TABLET BY MOUTH ONCE A WEEK     atorvastatin 20 MG tablet  Commonly known as:  LIPITOR     LUMIGAN 0.01 % Soln  Generic drug:  bimatoprost     bimatoprost 0.03 % ophthalmic solution  Commonly known as:  LUMIGAN  Place 1 drop into both eyes daily.     dorzolamide-timolol 22.3-6.8 MG/ML ophthalmic solution  Commonly known as:  COSOPT     ergocalciferol 50000 UNITS capsule  Commonly known as:  VITAMIN D2  Take 50,000 Units by mouth once a week.     levothyroxine 75 MCG tablet  Commonly known as:  SYNTHROID, LEVOTHROID  Take 75 mcg by mouth daily before breakfast.        Allergies: No Known Allergies  Past Medical History  Diagnosis Date  . Hyperlipidemia   . Hypothyroidism   . Glaucoma     Past Surgical History  Procedure Laterality Date  . Back surgery    . Tonsillectomy      Family History  Problem Relation Age of Onset  . Heart disease Mother   . Asthma Sister     Social History:  reports that he has never smoked. He does not have any smokeless tobacco  history on file. He reports that he does not drink alcohol or use illicit drugs.  REVIEW Of SYSTEMS:  He is having some low back pain as above, previously has had low back surgery for lumbar disc disease from unknown surgeon    Examination:   BP 130/70 mmHg  Pulse 60  Temp(Src) 98.1 F (36.7 C)  Resp 14  Ht 5' 3.25" (1.607 m)  Wt 194 lb 3.2 oz (88.089 kg)  BMI 34.11 kg/m2  SpO2 97%   He looks well.  No swelling of the eyes   NECK: no thyromegaly.         Biceps reflexes 2+ bilaterally at biceps with normal relaxation. Examination of the spine shows no tenderness or prominent spines, has some loss of lumbar lordosis    Assessment/Plan:   Hypothyroidism taking 75 g supplement and clinically doing well: labs pending.   Will adjust his dosage based on the TSH  Osteoporosis. This is associated with asymptomatic vertebral fracture; the treatment with Fosamax is tolerated well  and he is compliant with it   He has no height loss recently  He needs to continue Fosamax for a total of at least 5 years which will be in 2019, discussed need for continued treatment  Also needs vitamin D level assessment to adjust his dosage   He will followup in one year and continue following up or other problems with PCP  [facility] 09/18/2015, 12:50 PM

## 2015-09-18 NOTE — Telephone Encounter (Signed)
Need refil of, alendronate (FOSAMAX) 70 MG tablet send to,  [id number] ([place]), [place] - [address] [phone] (Phone) [phone] (Fax)

## 2015-09-18 NOTE — Telephone Encounter (Signed)
Patient need refill of Alendronate 70 mg a month supply send to  [facility] [place] ([place]), [place] - [address] [phone] (Phone) [phone] (Fax)

## 2015-09-19 LAB — VITAMIN D 25 HYDROXY (VIT D DEFICIENCY, FRACTURES): VITD: 29.83 ng/mL — ABNORMAL LOW (ref 30.00–100.00)

## 2015-10-10 ENCOUNTER — Other Ambulatory Visit: Payer: Self-pay | Admitting: Family Medicine

## 2015-10-10 ENCOUNTER — Ambulatory Visit
Admission: RE | Admit: 2015-10-10 | Discharge: 2015-10-10 | Disposition: A | Discharge status: Home / Self Care | Payer: Medicare Other | Source: Ambulatory Visit | Attending: Family Medicine | Admitting: Family Medicine

## 2015-10-10 DIAGNOSIS — R05 Cough: Secondary | ICD-10-CM

## 2015-10-10 DIAGNOSIS — R059 Cough, unspecified: Secondary | ICD-10-CM

## 2015-10-18 ENCOUNTER — Telehealth: Payer: Self-pay | Admitting: Endocrinology

## 2015-10-18 NOTE — Telephone Encounter (Addendum)
[facility] and [facility], Phone # [phone] Fax # [phone]

## 2015-10-18 NOTE — Telephone Encounter (Signed)
Labs faxed

## 2015-12-11 ENCOUNTER — Ambulatory Visit (INDEPENDENT_AMBULATORY_CARE_PROVIDER_SITE_OTHER): Payer: Medicare Other | Admitting: Internal Medicine

## 2015-12-11 ENCOUNTER — Encounter: Payer: Self-pay | Admitting: Internal Medicine

## 2015-12-11 ENCOUNTER — Other Ambulatory Visit: Payer: Self-pay | Admitting: Internal Medicine

## 2015-12-11 ENCOUNTER — Other Ambulatory Visit (INDEPENDENT_AMBULATORY_CARE_PROVIDER_SITE_OTHER): Payer: Medicare Other

## 2015-12-11 VITALS — BP 140/74 | HR 72 | Ht 64.0 in | Wt 188.8 lb

## 2015-12-11 DIAGNOSIS — R05 Cough: Secondary | ICD-10-CM

## 2015-12-11 DIAGNOSIS — R053 Chronic cough: Secondary | ICD-10-CM

## 2015-12-11 DIAGNOSIS — R55 Syncope and collapse: Secondary | ICD-10-CM

## 2015-12-11 LAB — CBC WITH DIFFERENTIAL/PLATELET
BASOS ABS: 0 10*3/uL (ref 0.0–0.1)
Basophils Relative: 0.6 % (ref 0.0–3.0)
EOS ABS: 0.2 10*3/uL (ref 0.0–0.7)
Eosinophils Relative: 4.1 % (ref 0.0–5.0)
HCT: 42.8 % (ref 39.0–52.0)
Hemoglobin: 14.3 g/dL (ref 13.0–17.0)
LYMPHS ABS: 2.3 10*3/uL (ref 0.7–4.0)
Lymphocytes Relative: 45.7 % (ref 12.0–46.0)
MCHC: 33.5 g/dL (ref 30.0–36.0)
MCV: 89.9 fl (ref 78.0–100.0)
MONO ABS: 0.6 10*3/uL (ref 0.1–1.0)
MONOS PCT: 11.3 % (ref 3.0–12.0)
NEUTROS ABS: 1.9 10*3/uL (ref 1.4–7.7)
NEUTROS PCT: 38.3 % — AB (ref 43.0–77.0)
PLATELETS: 172 10*3/uL (ref 150.0–400.0)
RBC: 4.76 Mil/uL (ref 4.22–5.81)
RDW: 14.2 % (ref 11.5–15.5)
WBC: 5 10*3/uL (ref 4.0–10.5)

## 2015-12-11 MED ORDER — PANTOPRAZOLE SODIUM 40 MG PO TBEC: 40.0000 mg | DELAYED_RELEASE_TABLET | Freq: Every day | ORAL | Status: DC

## 2015-12-11 MED ORDER — BENZONATATE 200 MG PO CAPS: ORAL_CAPSULE | ORAL | Status: DC

## 2015-12-11 MED ORDER — FAMOTIDINE 20 MG PO TABS: ORAL_TABLET | ORAL | Status: DC

## 2015-12-11 NOTE — Patient Instructions (Addendum)
Diagnosis is upper airway cough syndrome   First step is to stop fosfamax for now and start Pantoprazole (protonix) 40 mg   Take  30-60 min before first meal of the day and Pepcid (famotidine)  20 mg one @  bedtime until return to office - this is the best way to tell whether stomach acid is contributing to your problem.    For cough try tessilon pearls 200 mg every 6 hours as needed   GERD (REFLUX)  is an extremely common cause of respiratory symptoms just like yours , many times with no obvious heartburn at all.    It can be treated with medication, but also with lifestyle changes including elevation of the head of your bed (ideally with 6 inch  bed blocks),  Smoking cessation, avoidance of late meals, excessive alcohol, and avoid fatty foods, chocolate, peppermint, colas, red wine, and acidic juices such as orange juice.  NO MINT OR MENTHOL PRODUCTS SO NO COUGH DROPS  USE SUGARLESS CANDY INSTEAD (Jolley ranchers or Stover's or Life Savers) or even ice chips will also do - the key is to swallow to prevent all throat clearing. NO OIL BASED VITAMINS - use powdered substitutes.  Please remember to go to the lab department downstairs for your tests - we will call you with the results when they are available.  Please schedule a follow up office visit in 4 weeks, sooner if needed

## 2015-12-11 NOTE — Progress Notes (Signed)
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 eval by [place] with hoarseness /sorethroat/ drainage/ no better with tonsils out or allergy shots and varied s pattern but then noted onset of episodes of severe coughing x 2012 "with colds" and also sometimes with eating like something spicy or sometimes with  laughter and so severe  passing out with them sev times a year but cough increasing every year sinceonset in terms of frequency / severity  so referred to pulmonary clinic 12/11/2015 by Dr Azucena Cecil  eval  04/15/12  by Dr Craige Cotta with dx pnds/gerd (upper airway cough) rec maint on gerd rx and f/u in 3 m but did not return    12/11/2015 1st [facility] office visit/ Zailen Albarran  Re cough x 2012  On timolol eyedrops/ fosfamax Chief Complaint  Patient presents with  . Pulmonary Consult    Referred by Dr. Azucena Cecil. Pt c/o cough "for years" worse x 3 wks. He states that he sometimes coughs until "passes out" or has "seizure like symptoms".  Pt states cough is non prod. He states cough does not bother him often "just when I have a bad cold".   cough not active at present but has constant sensation of daytime pnds s excess mucus production   No  ther patterns in day to day or daytime variabilty or assoc sob or cp or chest tightness, subjective wheeze overt hb symptoms. No unusual exp hx or h/o childhood pna/ asthma or knowledge of premature birth.  Sleeping ok without nocturnal  or early am exacerbation  of respiratory  c/o's or need for noct saba. Also denies any obvious fluctuation of symptoms with weather or environmental changes or other aggravating or alleviating factors except as outlined above   Current Medications, Allergies, Complete Past Medical History, Past Surgical History, Family History, and Social History were reviewed in [employer] record.          Review of Systems   Constitutional: Negative for fever, chills, activity change, appetite change and unexpected weight change.  HENT: Negative for congestion, dental problem, postnasal drip, rhinorrhea, sneezing, sore throat, trouble swallowing and voice change.   Eyes: Negative for visual disturbance.  Respiratory: Positive for cough. Negative for choking and shortness of breath.   Cardiovascular: Negative for chest pain and leg swelling.  Gastrointestinal: Negative for nausea, vomiting and abdominal pain.  Genitourinary: Negative for difficulty urinating.  Musculoskeletal: Negative for arthralgias.  Skin: Negative for rash.  Psychiatric/Behavioral: Negative for behavioral problems and confusion.       Objective:   Physical Exam   amb bm extremely hoarse  Wt Readings from Last 3 Encounters:  12/11/15 188 lb 12.8 oz (85.639 kg)  09/18/15 194 lb 3.2 oz (88.089 kg)  09/16/14 186 lb 3.2 oz (84.46 kg)    Vital signs reviewed    HEENT: nl dentition, turbinates, and oropharynx. Nl external ear canals without cough reflex   NECK :  without JVD/Nodes/TM/ nl carotid upstrokes bilaterally   LUNGS: no acc muscle use,  Nl contour chest which is clear to A and P bilaterally without cough on insp or exp maneuvers   CV:  RRR  no s3 or murmur or increase in P2, no edema   ABD:  soft and nontender with nl inspiratory excursion in the supine position. No bruits or organomegaly, bowel  sounds nl  MS:  Nl gait/ ext warm without deformities, calf tenderness, cyanosis or clubbing No obvious joint restrictions   SKIN: warm and dry without lesions    NEURO:  alert, approp, nl sensorium with  no motor deficits      I personally reviewed images and agree with radiology impression as follows:  CXR:  10/10/15 No definite active infiltrate or effusion. No change in pleural-based soft tissue structure at the left lung base noted by prior CT.  Labs ordered 12/11/2015  Cbc with diff/ allergy profile        Assessment & Plan:

## 2015-12-13 ENCOUNTER — Encounter: Payer: Self-pay | Admitting: Internal Medicine

## 2015-12-13 NOTE — Assessment & Plan Note (Signed)
This is a chronic / recurrent problem reviewed with pt / wife / see uacs

## 2015-12-13 NOTE — Assessment & Plan Note (Addendum)
The most common causes of chronic cough in immunocompetent adults include the following: upper airway cough syndrome (UACS), previously referred to as postnasal drip syndrome (PNDS), which is caused by variety of rhinosinus conditions; (2) asthma; (3) GERD; (4) chronic bronchitis from cigarette smoking or other inhaled environmental irritants; (5) nonasthmatic eosinophilic bronchitis; and (6) bronchiectasis.   These conditions, singly or in combination, have accounted for up to 94% of the causes of chronic cough in prospective studies.   Other conditions have constituted no >6% of the causes in prospective studies These have included bronchogenic carcinoma, chronic interstitial pneumonia, sarcoidosis, left ventricular failure, ACEI-induced cough, and aspiration from a condition associated with pharyngeal dysfunction.    Chronic cough is often simultaneously caused by more than one condition. A single cause has been found from 38 to 82% of the time, multiple causes from 18 to 62%. Multiply caused cough has been the result of three diseases up to 42% of the time.       Based on hx and exam, this is most likely:  Classic Upper airway cough syndrome, so named because it's frequently impossible to sort out how much is  CR/sinusitis with freq throat clearing (which can be related to primary GERD)   vs  causing  secondary (" extra esophageal")  GERD from wide swings in gastric pressure that occur with throat clearing, often  promoting self use of mint and menthol lozenges that reduce the lower esophageal sphincter tone and exacerbate the problem further in a cyclical fashion.   These are the same pts (now being labeled as having "irritable larynx syndrome" by some cough centers) who not infrequently have a history of having failed to tolerate ace inhibitors,  dry powder inhalers or biphosphonates(his cough has clearly worsened on them)  or report having atypical reflux symptoms that don't respond to standard  doses of PPI , and are easily confused as having aecopd or asthma flares by even experienced allergists/ pulmonologists.    The first step is to maximize acid suppression and eliminate fosfamax  then regroup - he always gets plenty of warning before cough syncope so advised to not drive during coughing fits and lie down immediately to prevent injury.  I had an extended discussion with the patient reviewing all relevant studies completed to date and  lasting 35 / 60 min ov   1) Explained: The standardized cough guidelines published in Chest by [place] in 2006 are still the best available and consist of a multiple step process (up to 12!) , not a single office visit,  and are intended  to address this problem logically,  with an alogrithm dependent on response to empiric treatment at  each progressive step  to determine a specific diagnosis with  minimal addtional testing needed. Therefore if adherence is an issue or can't be accurately verified,  it's very unlikely the standard evaluation and treatment will be successful here.    Furthermore, response to therapy (other than acute cough suppression, which should only be used short term with avoidance of narcotic containing cough syrups if possible), can be a gradual process for which the patient may perceive immediate benefit.  Unlike going to an eye doctor where the best perscription is almost always the first one and is immediately effective, this is almost never the case in the management of chronic cough syndromes. Therefore the patient needs to commit up front to consistently adhere to recommendations  for up to 6 weeks of therapy directed at the likely  underlying problem(s) before the response can be reasonably evaluated.     2) Each maintenance medication was reviewed in detail including most importantly the difference between maintenance and prns and under what circumstances the prns are to be triggered using an action plan format that is  not reflected in the computer generated alphabetically organized AVS.    Please see instructions for details which were reviewed in writing and the patient given a copy highlighting the part that I personally wrote and discussed at today's ov.   See instructions for specific recommendations which were reviewed directly with the patient who was given a copy with highlighter outlining the key components.

## 2015-12-14 LAB — RESPIRATORY ALLERGY PROFILE REGION II ~~LOC~~
Allergen, Cedar tree, t12: <0.1 kU/L
Allergen, Comm Silver Birch, t9: <0.1 kU/L
Allergen, Mouse Urine Protein, e78: <0.1 kU/L
Allergen, Oak,t7: <0.1 kU/L
Alternaria Alternata: <0.1 kU/L
Aspergillus fumigatus, m3: <0.1 kU/L
Box Elder IgE: <0.1 kU/L
Cat Dander: <0.1 kU/L
Common Ragweed: <0.1 kU/L
IGE (IMMUNOGLOBULIN E), SERUM: 22 kU/L (ref <115)
Penicillium Notatum: <0.1 kU/L
Rough Pigweed  IgE: <0.1 kU/L
Sheep Sorrel IgE: <0.1 kU/L
Timothy Grass: <0.1 kU/L

## 2016-01-01 ENCOUNTER — Telehealth: Payer: Self-pay | Admitting: Internal Medicine

## 2016-01-01 NOTE — Telephone Encounter (Signed)
Pt returning call.Stephen Garcia ° °

## 2016-01-01 NOTE — Telephone Encounter (Signed)
[phone], pt cb

## 2016-01-01 NOTE — Telephone Encounter (Signed)
Most likley pantoprazole so d/c this and add extra pepcid  after bfast daily until returns

## 2016-01-01 NOTE — Telephone Encounter (Signed)
Called spoke with pt. He is aware of recs below. Nothing further needed

## 2016-01-01 NOTE — Telephone Encounter (Signed)
LVM for pt to return call

## 2016-01-01 NOTE — Telephone Encounter (Signed)
LMOMTCB x 1 

## 2016-01-01 NOTE — Telephone Encounter (Signed)
Per 12/11/15 OV: Patient Instructions       Diagnosis is upper airway cough syndrome  First step is to stop fosfamax for now and start Pantoprazole (protonix) 40 mg   Take  30-60 min before first meal of the day and Pepcid (famotidine)  20 mg one @  bedtime until return to office - this is the best way to tell whether stomach acid is contributing to your problem.   For cough try tessilon pearls 200 mg every 6 hours as needed  GERD (REFLUX)  is an extremely common cause of respiratory symptoms just like yours , many times with no obvious heartburn at all.   It can be treated with medication, but also with lifestyle changes including elevation of the head of your bed (ideally with 6 inch  bed blocks),  Smoking cessation, avoidance of late meals, excessive alcohol, and avoid fatty foods, chocolate, peppermint, colas, red wine, and acidic juices such as orange juice.   NO MINT OR MENTHOL PRODUCTS SO NO COUGH DROPS  USE SUGARLESS CANDY INSTEAD (Jolley ranchers or Stover's or Life Savers) or even ice chips will also do - the key is to swallow to prevent all throat clearing. NO OIL BASED VITAMINS - use powdered substitutes. Please remember to go to the lab department downstairs for your tests - we will call you with the results when they are available. Please schedule a follow up office visit in 4 weeks, sooner if needed   ---  Called spoke with pt. He reports one of his medications is making him itch very bad. He takes pepcid 20 qhs, pantoprazole 40 mg 60 min prior to 1st meal of day and  tess pearls PRN. He is scheduled to see MW on 01/12/16.   Please advise Dr. Sherene Sires thanks

## 2016-01-08 ENCOUNTER — Ambulatory Visit: Payer: Medicare Other | Admitting: Internal Medicine

## 2016-01-12 ENCOUNTER — Encounter: Payer: Self-pay | Admitting: Internal Medicine

## 2016-01-12 ENCOUNTER — Ambulatory Visit (INDEPENDENT_AMBULATORY_CARE_PROVIDER_SITE_OTHER): Payer: Medicare Other | Admitting: Internal Medicine

## 2016-01-12 VITALS — BP 132/66 | HR 62 | Ht 64.0 in | Wt 191.2 lb

## 2016-01-12 DIAGNOSIS — R05 Cough: Secondary | ICD-10-CM | POA: Diagnosis not present

## 2016-01-12 DIAGNOSIS — R058 Other specified cough: Secondary | ICD-10-CM

## 2016-01-12 DIAGNOSIS — R55 Syncope and collapse: Secondary | ICD-10-CM

## 2016-01-12 MED ORDER — PREDNISONE 10 MG PO TABS: ORAL_TABLET | ORAL | Status: DC

## 2016-01-12 MED ORDER — MONTELUKAST SODIUM 10 MG PO TABS: 10.0000 mg | ORAL_TABLET | Freq: Every day | ORAL | Status: DC

## 2016-01-12 MED ORDER — FAMOTIDINE 20 MG PO TABS: ORAL_TABLET | ORAL | Status: DC

## 2016-01-12 MED ORDER — BENZONATATE 200 MG PO CAPS: ORAL_CAPSULE | ORAL | Status: DC

## 2016-01-12 NOTE — Assessment & Plan Note (Addendum)
PFT 04/15/12>>FEV1 2.29 (91%), FEV1% 81, TLC 4.59(83%), DLCO 65%, no BD. - trial off fosfamax and on gerd rx 12/11/2015 >  - Allergy profile 12/11/2015 >  Eos 0.2 /  IgE 22 with Neg RAST - Sinus Ct 01/12/2016 >>>    Chronic cough x at least 5 years assoc with presyncope maybe 50% improved despite pt not able to adhere to recs  Of the three most common causes of chronic cough, only one (GERD)  can actually cause the other two (asthma and post nasal drip syndrome)  and perpetuate the cylce of cough inducing airway trauma, inflammation, heightened sensitivity to reflux which is prompted by the cough itself via a cyclical mechanism.    This may partially respond to steroids and look like asthma and post nasal drainage but never erradicated completely unless the cough and the secondary reflux are eliminated, preferably both at the same time.  While not intuitively obvious, many patients with chronic low grade reflux do not cough until there is a secondary insult that disturbs the protective epithelial barrier and exposes sensitive nerve endings.  This can be viral or direct physical injury such as with an endotracheal tube.   The point is that once this occurs, it is difficult to eliminate using anything but a maximally effective acid suppression regimen at least in the short run, accompanied by an appropriate diet to address non acid GERD.   Since not able to take ppi apparently and only 50% improved need to start working thru the cough algorithm as per the cough guidelines  The standardized cough guidelines published in Chest by [place] in 2006 are still the best available and consist of a multiple step process (up to 12!) , not a single office visit,  and are intended  to address this problem logically,  with an alogrithm dependent on response to empiric treatment at  each progressive step  to determine a specific diagnosis with  minimal addtional testing needed. Therefore if adherence is an issue or  can't be accurately verified,  it's very unlikely the standard evaluation and treatment will be successful here.    Furthermore, response to therapy (other than acute cough suppression, which should only be used short term with avoidance of narcotic containing cough syrups if possible), can be a gradual process for which the patient may perceive immediate benefit.  Unlike going to an eye doctor where the best perscription is almost always the first one and is immediately effective, this is almost never the case in the management of chronic cough syndromes. Therefore the patient needs to commit up front to consistently adhere to recommendations  for up to 6 weeks of therapy directed at the likely underlying problem(s) before the response can be reasonably evaluated.    rec next do sinus ct/ trial of singulair p pred x 6 days and suppress with max h2 and tessilon perls     I had an extended discussion with the patient reviewing all relevant studies completed to date and  lasting 25  minutes of a 40 minute ext visit    Each maintenance medication was reviewed in detail including most importantly the difference between maintenance and prns and under what circumstances the prns are to be triggered using an action plan format that is not reflected in the computer generated alphabetically organized AVS.    Please see instructions for details which were reviewed in writing and the patient given a copy highlighting the part that I personally wrote and discussed  at today's ov.

## 2016-01-12 NOTE — Patient Instructions (Addendum)
Please see patient coordinator before you leave today  to schedule sinus CT   Pepcid 20 mg after bfast and after supper   For drainage / throat tickle try take CHLORPHENIRAMINE  4 mg - take one every 4 hours as needed - available over the counter- may cause drowsiness so start with just a bedtime dose or two and see how you tolerate it before trying in daytime    Prednisone 10 mg take  4 each am x 2 days,   2 each am x 2 days,  1 each am x 2 days and stop   Take enough tessalon to where you don't cough at all x 5 straight days then stop   singulair (montelukast) 10 mg  At bedtime   Please schedule a follow up office visit in 4 weeks, sooner if needed with all medications and candy

## 2016-01-12 NOTE — Progress Notes (Signed)
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 eval by [facility] with hoarseness /sorethroat/ drainage/ no better with tonsils out or allergy shots and varied s pattern but then noted onset of episodes of severe coughing x 2012 "with colds" and also sometimes with eating like something spicy or sometimes with  laughter and so severe  passing out with them sev times a year but cough increasing every year sinceonset in terms of frequency / severity  so referred to pulmonary clinic 12/11/2015 by Dr Azucena Cecil  eval  04/15/12  by Dr Craige Cotta with dx pnds/gerd (upper airway cough) rec maint on gerd rx and f/u in 3 m but did not return    12/11/2015 1st [facility] office visit/ Wert  Re cough x 2012  On timolol eyedrops/ fosfamax Chief Complaint  Patient presents with  . Pulmonary Consult    Referred by Dr. Azucena Cecil. Pt c/o cough "for years" worse x 3 wks. He states that he sometimes coughs until "passes out" or has "seizure like symptoms".  Pt states cough is non prod. He states cough does not bother him often "just when I have a bad cold".   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   For cough try tessilon pearls 200 mg every 6 hours as needed  GERD diet  Allergy profile>  neg     01/12/2016  f/u ov/Wert re: bad cough since 2012  Chief Complaint  Patient presents with  . Follow-up    pt's cough improved but still present.  cough is nonprod, worse after eating spicy food.  Pt's wife states pt goes into  "spells" after coughing sometimes-wife states eyes roll back, starts shaking Xseveral seconds-pt does not remember these events.    cough is dry  more day than night  / worse with colds/ some better with pepcid, could not take pantoprazole at all   No obvious day to day or daytime variability or assoc chronic cough or cp or chest tightness, subjective wheeze or overt sinus or hb symptoms. No unusual exp hx or h/o childhood pna/ asthma or knowledge of premature birth.  Sleeping ok without nocturnal  or early am exacerbation  of respiratory  c/o's or need for noct saba. Also denies any obvious fluctuation of symptoms with weather or environmental changes or other aggravating or alleviating factors except as outlined above   Current Medications, Allergies, Complete Past Medical History, Past Surgical History, Family History, and Social History were reviewed in [employer] record.  ROS  The following are not active complaints unless bolded sore throat, dysphagia, dental problems, itching, sneezing,  nasal congestion or excess/ purulent secretions, ear ache,   fever, chills, sweats, unintended wt loss, classically pleuritic or exertional cp, hemoptysis,  orthopnea pnd or leg swelling, presyncope, palpitations, abdominal pain, anorexia, nausea, vomiting, diarrhea  or change in bowel or bladder habits, change in stools  or urine, dysuria,hematuria,  rash, arthralgias, visual complaints, headache, numbness, weakness or ataxia or problems with walking or coordination,  change in mood/affect or memory.                      Objective:   Physical Exam   amb bm extremely hoarse   01/12/2016         191   12/11/15 188 lb 12.8 oz (85.639 kg)  09/18/15 194 lb 3.2 oz (88.089 kg)  09/16/14 186 lb 3.2 oz (84.46 kg)    Vital signs reviewed    HEENT: nl dentition, turbinates, and oropharynx. Nl external ear canals without cough reflex   NECK :  without JVD/Nodes/TM/ nl carotid  upstrokes bilaterally   LUNGS: no acc muscle use,  Nl contour chest which is clear to A and P bilaterally with cough at end insp   CV:  RRR  no s3 or murmur or increase in P2, no edema   ABD:  soft and nontender with nl inspiratory excursion in the supine position. No bruits or organomegaly, bowel sounds nl  MS:  Nl gait/ ext warm without deformities, calf tenderness, cyanosis or clubbing No obvious joint restrictions   SKIN: warm and dry without lesions    NEURO:  alert, approp, nl sensorium with  no motor deficits      I personally reviewed images and agree with radiology impression as follows:  CXR:  10/10/15 No definite active infiltrate or effusion. No change in pleural-based soft tissue structure at the left lung base noted by prior CT.        Assessment & Plan:

## 2016-01-12 NOTE — Assessment & Plan Note (Addendum)
Discussed pathophysiology again/ warned re driving/ coughing and need to assume supine position immediately if possible

## 2016-01-17 ENCOUNTER — Ambulatory Visit (INDEPENDENT_AMBULATORY_CARE_PROVIDER_SITE_OTHER)
Admission: RE | Admit: 2016-01-17 | Discharge: 2016-01-17 | Disposition: A | Discharge status: Home / Self Care | Payer: Medicare Other | Source: Ambulatory Visit | Attending: Internal Medicine | Admitting: Internal Medicine

## 2016-01-17 DIAGNOSIS — R058 Other specified cough: Secondary | ICD-10-CM

## 2016-01-17 DIAGNOSIS — R05 Cough: Secondary | ICD-10-CM | POA: Diagnosis not present

## 2016-01-18 NOTE — Progress Notes (Signed)
Quick Note:  Left detailed msg with results ______ 

## 2016-02-09 ENCOUNTER — Ambulatory Visit: Payer: Medicare Other | Admitting: Internal Medicine

## 2016-02-12 ENCOUNTER — Telehealth: Payer: Self-pay | Admitting: Internal Medicine

## 2016-02-12 ENCOUNTER — Ambulatory Visit (INDEPENDENT_AMBULATORY_CARE_PROVIDER_SITE_OTHER): Payer: Medicare Other | Admitting: Internal Medicine

## 2016-02-12 ENCOUNTER — Encounter: Payer: Self-pay | Admitting: Internal Medicine

## 2016-02-12 VITALS — BP 130/76 | HR 67 | Ht 64.0 in | Wt 188.6 lb

## 2016-02-12 DIAGNOSIS — R058 Other specified cough: Secondary | ICD-10-CM

## 2016-02-12 DIAGNOSIS — R05 Cough: Secondary | ICD-10-CM

## 2016-02-12 MED ORDER — FAMOTIDINE 20 MG PO TABS: ORAL_TABLET | ORAL | Status: DC

## 2016-02-12 NOTE — Assessment & Plan Note (Signed)
PFT 04/15/12>>FEV1 2.29 (91%), FEV1% 81, TLC 4.59(83%), DLCO 65%, no BD. - trial off fosfamax and on gerd rx 12/11/2015 > could not tol PPI  - Allergy profile 12/11/2015 >  Eos 0.2 /  IgE 22 with Neg RAST - Sinus Ct 01/17/2016 > No evidence of sinusitis.  No evidence of asthma or eos bronchitis or any other pulmonary problem.  Assoc with severe refractory hoarseness points to the upper airway and allergy profile points away from allergy so ENT eval next step.  I had an extended discussion with the patient reviewing all relevant studies completed to date and  lasting 15 to 20 minutes of a 25 minute visit   Ok to try back off the singulair (reverse therapeutic trial) / continue max rx for GERD until seen by ENT  Each maintenance medication was reviewed in detail including most importantly the difference between maintenance and prns and under what circumstances the prns are to be triggered using an action plan format that is not reflected in the computer generated alphabetically organized AVS.    Please see instructions for details which were reviewed in writing and the patient given a copy highlighting the part that I personally wrote and discussed at today's ov.

## 2016-02-12 NOTE — Telephone Encounter (Signed)
Spoke with the pt  He states Chlorpheniramine was what he had taken in the past to help with nasal congestion  Will forward to MW to make him aware

## 2016-02-12 NOTE — Patient Instructions (Signed)
Please see patient coordinator before you leave today  to schedule ENT eval for your hoarseness   Please call me back with the name of the medication that you feel helped your nasal congestion   For drainage / throat tickle try take CHLORPHENIRAMINE  4 mg - take one every 4 hours as needed - available over the counter- may cause drowsiness so start with just a bedtime dose or two and see how you tolerate it before trying in daytime    Pepcid should be 20 mg after bfast and after supper since you can't take the reflux medication called pantoprazole   Finish up the montelukast and then try off to see what difference this makes - if not worse don't take it   I will let Dr Lucianne MussKumar know about the Fosfamax and the potential adverse effect on your throat (especially since you can't take the reflux medicines)   Pulmonary is as needed

## 2016-02-12 NOTE — Progress Notes (Signed)
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 [employer] with hoarseness /sorethroat/ drainage/ no better with tonsils out or allergy shots and varied s pattern but then noted onset of episodes of severe coughing x 2012 "with colds" and also sometimes with eating like something spicy or sometimes with  laughter and so severe  passing out with them sev times a year but cough increasing every year since onset in terms of frequency / severity  so referred to pulmonary clinic 12/11/2015 by Dr Azucena Cecil  eval  04/15/12  by Dr Craige Cotta with dx pnds/gerd (upper airway cough) rec maint on gerd rx and f/u in 3 m but did not return    12/11/2015 1st [facility] office visit/ Stephen Garcia  Re cough x 2012  On timolol eyedrops/ fosfamax Chief Complaint  Patient presents with  . Pulmonary Consult    Referred by Dr. Azucena Cecil. Pt c/o cough "for years" worse x 3 wks. He states that he sometimes coughs until "passes out" or has "seizure like symptoms".  Pt states cough is non prod. He states cough does not bother him often "just when I have a bad cold".   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 Follow-up    pt's cough improved but still present.  cough is nonprod, worse after eating spicy food.  Pt's wife states pt goes into "spells" after coughing sometimes-wife states eyes roll back, starts shaking Xseveral  seconds-pt does not remember these events.    cough is dry  more day than night  / worse with colds/ some better with pepcid, could not take pantoprazole at all  rec Please see patient coordinator before you leave today  to schedule sinus CT> net   Pepcid 20 mg after bfast and after supper  For drainage / throat tickle try take CHLORPHENIRAMINE  4 mg - take one every 4 hours as needed - available over the counter- may cause drowsiness so start with just a bedtime dose or two and see how you tolerate it before trying in daytime   Prednisone 10 mg take  4 each am x 2 days,   2 each am x 2 days,  1 each am x 2 days and stop  Take enough tessalon to where you don't cough at all x 5 straight days then stop  Singulair (montelukast) 10 mg  At bedtime  Please schedule a follow up office visit in 4 weeks, sooner if needed with all medications and candy    02/12/2016  f/u ov/Stephen Garcia re: uacs with variable assoc hoarseness x 1970's worse since 2012  Chief Complaint  Patient presents with  . Follow-up    Cough is unchanged. No new co's today.   prednisone may  have helped a little but only trasiently/ not clear he followed recs esp re h1 "something I put up my nose you gave me helped my nasal congestion (note we didn't rec any topicals) - still variable daytime dry cough with severe hoarseness   .Not limited by breathing from desired activities    No obvious day to day or daytime variability or assoc excess/ purulent sputum or mucus plugs   or cp or chest tightness, subjective wheeze or overt sinus or hb symptoms. No unusual exp hx or h/o childhood pna/ asthma or knowledge of premature birth.  Sleeping ok without nocturnal  or early am exacerbation  of respiratory  c/o's or need for noct saba. Also denies any obvious fluctuation of symptoms with weather or environmental changes or other aggravating or alleviating factors except as outlined above   Current Medications, Allergies, Complete Past Medical History,  Past Surgical History, Family History, and Social History were reviewed in [facility] electronic medical record.   ROS  The following are not active complaints unless bolded sore throat, dysphagia, dental problems, itching, sneezing,  nasal congestion better  or excess/ purulent secretions, ear ache,   fever, chills, sweats, unintended wt loss, classically pleuritic or exertional cp, hemoptysis,  orthopnea pnd or leg swelling, presyncope, palpitations, abdominal pain, anorexia, nausea, vomiting, diarrhea  or change in bowel or bladder habits, change in stools or urine, dysuria,hematuria,  rash, arthralgias, visual complaints, headache, numbness, weakness or ataxia or problems with walking or coordination,  change in mood/affect or memory.                  Objective:   Physical Exam   amb bm extremely hoarse     01/12/2016         191   12/11/15 188 lb 12.8 oz (85.639 kg)  09/18/15 194 lb 3.2 oz (88.089 kg)  09/16/14 186 lb 3.2 oz (84.46 kg)    Vital signs reviewed    HEENT: nl dentition, turbinates, and oropharynx. Nl external ear canals without cough reflex   NECK :  without JVD/Nodes/TM/ nl carotid upstrokes bilaterally   LUNGS: no acc muscle use,  Nl contour chest which is clear to A and P bilaterally with cough at end insp   CV:  RRR  no s3 or murmur or increase in P2, no edema   ABD:  soft and nontender with nl inspiratory excursion in the supine position. No bruits or organomegaly, bowel sounds nl  MS:  Nl gait/ ext warm without deformities, calf tenderness, cyanosis or clubbing No obvious joint restrictions   SKIN: warm and dry without lesions    NEURO:  alert, approp, nl sensorium with  no motor deficits      I personally reviewed images and agree with radiology impression as follows:  CXR:  10/10/15 No definite active infiltrate or effusion. No change in pleural-based soft tissue structure at the left lung base noted by prior CT.        Assessment  & Plan:

## 2016-02-13 NOTE — Telephone Encounter (Signed)
Noted and encouraged to use it prn - see ov 02/12/16

## 2016-04-17 ENCOUNTER — Encounter: Payer: Self-pay | Admitting: Podiatry

## 2016-04-17 ENCOUNTER — Ambulatory Visit (INDEPENDENT_AMBULATORY_CARE_PROVIDER_SITE_OTHER): Payer: Medicare Other

## 2016-04-17 ENCOUNTER — Ambulatory Visit (INDEPENDENT_AMBULATORY_CARE_PROVIDER_SITE_OTHER): Payer: Medicare Other | Admitting: Podiatry

## 2016-04-17 VITALS — BP 126/73 | HR 54 | Resp 12

## 2016-04-17 DIAGNOSIS — M79671 Pain in right foot: Secondary | ICD-10-CM | POA: Diagnosis not present

## 2016-04-17 DIAGNOSIS — M2021 Hallux rigidus, right foot: Secondary | ICD-10-CM | POA: Diagnosis not present

## 2016-04-17 NOTE — Patient Instructions (Addendum)
Return for follow-up if the pain continues to increase or if you have any significant increase of warmth and swelling in or around the great toe joint  Hallux Rigidus Hallux rigidus is a condition involving pain and a loss of motion of the first (big) toe. The pain gets worse with lifting up (extension) of the toe. This is usually due to arthritic bony bumps (spurring) of the joint at the base of the big toe.  SYMPTOMS   Pain, with lifting up of the toe.  Tenderness over the joint where the big toe meets the foot.  Redness, swelling, and warmth over the top of the base of the big toe (sometimes).  Foot pain, stiffness, and limping. CAUSES  Hallux rigidus is caused by arthritis of the joint where the big toe meets the foot. The arthritis creates a bone spur that pinches the soft tissues when the toe is extended. RISK INCREASES WITH:  Tight shoes with a narrow toe box.  Family history of foot problems.  Gout and rheumatoid and psoriatic arthritis.  History of previous toe injury, including "turf toe."  Long first toe, flat feet, and other big toe bony bumps.  Arthritis of the big toe. PREVENTION   Wear wide-toed shoes that fit well.  Tape the big toe to reduce motion and to prevent pinching of the tissues between the bone.  Maintain physical fitness:  Foot and ankle flexibility.  Muscle strength and endurance. PROGNOSIS  This condition can usually be managed with proper treatment. However, surgery is typically required to prevent the problem from recurring.  RELATED COMPLICATIONS  Injury to other areas of the foot or ankle, caused by abnormal walking in an attempt to avoid the pain felt when walking normally. TREATMENT Treatment first involves stopping the activities that aggravate your symptoms. Ice and medicine can be used to reduce the pain and inflammation. Modifications to shoes may help reduce pain, including wearing stiff-soled shoes, shoes with a wide toe box,  inserting a padded donut to relieve pressure on top of the joint, or wearing an arch support. Corticosteroid injections may be given to reduce inflammation. If nonsurgical treatment is unsuccessful, surgery may be needed. Surgical options include removing the arthritic bony spur, cutting a bone in the foot to change the arc of motion (allowing the toe to extend more), or fusion of the joint (eliminating all motion in the joint at the base of the big toe).  MEDICATION   If pain medicine is needed, nonsteroidal anti-inflammatory medicines (aspirin and ibuprofen), or other minor pain relievers (acetaminophen), are often advised.  Do not take pain medicine for 7 days before surgery.  Prescription pain relievers are usually prescribed only after surgery. Use only as directed and only as much as you need.  Ointments for arthritis, applied to the skin, may give some relief.  Injections of corticosteroids may be given to reduce inflammation. HEAT AND COLD  Cold treatment (icing) relieves pain and reduces inflammation. Cold treatment should be applied for 10 to 15 minutes every 2 to 3 hours, and immediately after activity that aggravates your symptoms. Use ice packs or an ice massage.  Heat treatment may be used before performing the stretching and strengthening activities prescribed by your caregiver, physical therapist, or athletic trainer. Use a heat pack or a warm water soak. SEEK MEDICAL CARE IF:   Symptoms get worse or do not improve in 2 weeks, despite treatment.  After surgery you develop fever, increasing pain, redness, swelling, drainage of fluids, bleeding,  or increasing warmth.  New, unexplained symptoms develop. (Drugs used in treatment may produce side effects.)   This information is not intended to replace advice given to you by your health care provider. Make sure you discuss any questions you have with your health care provider.   Document Released: 10/28/2005 Document Revised:  11/18/2014 Document Reviewed: 02/09/2009 Elsevier Interactive Patient Education [employer]

## 2016-04-17 NOTE — Progress Notes (Signed)
   Subjective:    Patient ID: Stephen Garcia, male    DOB: 05/22/1941, 75 y.o.   MRN: [id number]  HPI    Patient presents today complaining of pain in or around the right great toe joint area for the past 8 months with gradually increasing symptoms in the past month. He initially describes the pain is occasionally intermittent with weightbearing, however, over time the pain is more consistent with standing and walking. He has tried soaking and applying icy hot rubs which she says has significantly reduced the symptoms in around the right great toe joint area. Patient's wife is present in the treatment room  Review of Systems  HENT: Positive for hearing loss.   Musculoskeletal: Positive for back pain and joint swelling.       Objective:   Physical Exam  Orientated 3  Vascular: No peripheral edema bilaterally No calf pain calf tenderness bilaterally DP pulses 2/4 bilaterally PT pulses 4/4 bilaterally Capillary reflex immediate bilaterally  Neurological: Sensation to 10 g monofilament wire intact 5/5 bilaterally Vibratory sensation reactive bilaterally Ankle reflex equal and reactive bilaterally  Dermatological: Noted skin lesions bilaterally  Musculoskeletal: Limited range of motion first MPJ bilaterally Tenderness on and range of motion right first MPJ   X-ray examinatin weightbearing right foot dated 04/17/2016  No fracture and/or dislocation Decreased bone density in all views The first MPJ joint space is narrowed  Radiographic impression: No acute bony abnormality noted in the right foot Osteoarthritis first MPJ right    Assessment & Plan:   Assessment: Satisfactory neurovascular status Hallux rigidus right  Plan: I reviewed the results of the exam an x-ray with patient today and made him aware that he had localized osteoarthritis in the right first MPJ which was the cause of this pain. At this time the symptoms have significantly reduced. We discussed shoeing  and adjusting activity to tolerance. He'll return as needed if the symptoms exacerbate  Reappoint at patient's request

## 2016-04-23 LAB — BASIC METABOLIC PANEL: CREATININE: 1.1 mg/dL (ref <=1.3)

## 2016-05-08 ENCOUNTER — Telehealth: Payer: Self-pay | Admitting: Endocrinology

## 2016-05-08 NOTE — Telephone Encounter (Signed)
I contacted the pt and and scheduled his f/u for 05/11/2016. Requested results from Dr. Merita NortonSwayne's office to be faxed to our office for the pt's up coming appt.

## 2016-05-08 NOTE — Telephone Encounter (Signed)
Mr Stephen Garcia returning call

## 2016-05-08 NOTE — Telephone Encounter (Signed)
He was told by Dr. Sherene SiresWert to change Fosamax.  Need to have him come in for an appointment to discuss Reclast injections Also will need to reports of recent chemistry from PCP Dr. Azucena CecilSwayne

## 2016-05-20 ENCOUNTER — Ambulatory Visit (INDEPENDENT_AMBULATORY_CARE_PROVIDER_SITE_OTHER): Payer: Medicare Other | Admitting: Endocrinology

## 2016-05-20 ENCOUNTER — Encounter: Payer: Self-pay | Admitting: Endocrinology

## 2016-05-20 VITALS — BP 136/80 | HR 63 | Ht 64.0 in | Wt 186.0 lb

## 2016-05-20 DIAGNOSIS — M810 Age-related osteoporosis without current pathological fracture: Secondary | ICD-10-CM | POA: Diagnosis not present

## 2016-05-20 NOTE — Progress Notes (Signed)
Patient ID: Stephen Garcia, male   DOB: 06/05/1941, 75 y.o.   MRN: [id number]   Reason for Appointment:  followup visit  Chief complaint: Hoarseness   History of Present Illness:    OSTEOPOROSIS: He has known vertebral compression fracture of L2 on his MRI which was diagnosed on x-rays for nonspecific back pain Initial lumbar compression fractures of L1, L2 were noticed in 2011 on a routine x-ray   Also has had lumbar disc disease and has had chronic low back pain  He has a bone density done previously indicating osteoporosis. Last T score was reportedly -3.0 His last height was 5 feet 3.5  Because of his osteoporosis and history of fractures he has been on Fosamax weekly since 2014  He was told by the pulmonologist to stop this because of potential for creating reflux. However he has had cough and hoarseness for several years on and off even before starting Fosamax  Was told to stop the Fosamax either in March or April by pulmonologist but only recently has started feeling better with using allergy treatments and antireflux medication  Vitamin D supplementation: He is taking 50,000 units on every other Sunday and the level was normal recently from PCP at 42.5 last year   HYPOTHYROIDISM: was first diagnosed  several years ago Unclear what his initial symptoms at diagnosis were Previously his TSH has been persistently low until 5/14 when it was 0.63 and his dose had been reduced   Complaints are reported by the patient now are none, no unusual fatigue or cold intolerance  He says that he has been trying to lose weight                Currently taking 75 g levothyroxine daily Compliance with the medical regimen has been as prescribed with taking the tablet in the morning before breakfast.  Last TSH done in 04/2016 was normal and about the same at 0.54  Lab Results  Component Value Date   TSH 0.57 09/18/2015   TSH 0.42 09/16/2014   FREET4 0.51* 09/18/2015   FREET4 0.72  09/16/2014    Weight history:  Wt Readings from Last 3 Encounters:  05/20/16 186 lb (84.369 kg)  02/12/16 188 lb 9.6 oz (85.548 kg)  01/12/16 191 lb 3.2 oz (86.728 kg)          Medication List       This list is accurate as of: 05/20/16 11:59 PM.  Always use your most recent med list.               atorvastatin 20 MG tablet  Commonly known as:  LIPITOR     bimatoprost 0.03 % ophthalmic solution  Commonly known as:  LUMIGAN  Place 1 drop into both eyes daily.     ergocalciferol 50000 units capsule  Commonly known as:  VITAMIN D2  Take 50,000 Units by mouth once a week.     levothyroxine 75 MCG tablet  Commonly known as:  SYNTHROID, LEVOTHROID  Take 75 mcg by mouth daily before breakfast.     montelukast 10 MG tablet  Commonly known as:  SINGULAIR  Take 1 tablet (10 mg total) by mouth at bedtime.        Allergies: No Known Allergies  Past Medical History  Diagnosis Date  . Hyperlipidemia   . Hypothyroidism   . Glaucoma     Past Surgical History  Procedure Laterality Date  . Back surgery    . Tonsillectomy  Family History  Problem Relation Age of Onset  . Heart disease Mother   . Asthma Sister     Social History:  reports that he has never smoked. He has never used smokeless tobacco. He reports that he does not drink alcohol or use illicit drugs.  REVIEW Of SYSTEMS:  He is having Persistent hoarseness     Examination:   BP 136/80 mmHg  Pulse 63  Ht  (1.626 m)  Wt 186 lb (84.369 kg)  BMI 31.91 kg/m2  SpO2 96%   He looks well.     Assessment/Plan:     Osteoporosis. This is associated with asymptomatic vertebral fracture; the treatment with Fosamax has previously been tolerated well Not clear if his hoarseness and cough are related to Fosamax but he was advised not to continue  Discussed using Reclast as a safer option to prevent reflux and given him detailed information on this.  Discussed how this is given and actions  and possible side effects He will be scheduled for this Also will need periodic bone density assessment  Also needs vitamin D level assessment annually   Hypothyroidism taking 75 g supplement and clinically doing well: TSH normal again   He will followup in 6 months and continue following up with PCP  [facility] 05/21/2016, 8:08 AM

## 2016-05-20 NOTE — Patient Instructions (Signed)
Rx will be generic for Reclast

## 2016-05-22 ENCOUNTER — Emergency Department (HOSPITAL_COMMUNITY): Payer: No Typology Code available for payment source

## 2016-05-22 ENCOUNTER — Encounter (HOSPITAL_COMMUNITY): Payer: Self-pay | Admitting: Emergency Medicine

## 2016-05-22 ENCOUNTER — Emergency Department (HOSPITAL_COMMUNITY)
Admission: EM | Admit: 2016-05-22 | Discharge: 2016-05-22 | Disposition: A | Discharge status: Home / Self Care | Payer: No Typology Code available for payment source | Attending: Emergency Medicine | Admitting: Emergency Medicine

## 2016-05-22 DIAGNOSIS — Y939 Activity, unspecified: Secondary | ICD-10-CM | POA: Diagnosis not present

## 2016-05-22 DIAGNOSIS — Y92410 Unspecified street and highway as the place of occurrence of the external cause: Secondary | ICD-10-CM | POA: Insufficient documentation

## 2016-05-22 DIAGNOSIS — Y999 Unspecified external cause status: Secondary | ICD-10-CM | POA: Insufficient documentation

## 2016-05-22 DIAGNOSIS — S161XXA Strain of muscle, fascia and tendon at neck level, initial encounter: Secondary | ICD-10-CM

## 2016-05-22 DIAGNOSIS — S199XXA Unspecified injury of neck, initial encounter: Secondary | ICD-10-CM | POA: Diagnosis present

## 2016-05-22 DIAGNOSIS — R55 Syncope and collapse: Secondary | ICD-10-CM | POA: Diagnosis not present

## 2016-05-22 LAB — BASIC METABOLIC PANEL
Anion gap: 7 (ref 5–15)
BUN: 10 mg/dL (ref 6–20)
CO2: 24 mmol/L (ref 22–32)
Calcium: 9.3 mg/dL (ref 8.9–10.3)
Chloride: 105 mmol/L (ref 101–111)
Creatinine, Ser: 1.1 mg/dL (ref 0.61–1.24)
GFR calc Af Amer: >60 mL/min (ref >60)
GFR calc non Af Amer: >60 mL/min (ref >60)
Glucose, Bld: 99 mg/dL (ref 65–99)
Potassium: 3.9 mmol/L (ref 3.5–5.1)
Sodium: 136 mmol/L (ref 135–145)

## 2016-05-22 LAB — CBC
HCT: 41.4 % (ref 39.0–52.0)
Hemoglobin: 13.8 g/dL (ref 13.0–17.0)
MCH: 29.8 pg (ref 26.0–34.0)
MCHC: 33.3 g/dL (ref 30.0–36.0)
MCV: 89.4 fL (ref 78.0–100.0)
Platelets: 160 10*3/uL (ref 150–400)
RBC: 4.63 MIL/uL (ref 4.22–5.81)
RDW: 13.7 % (ref 11.5–15.5)
WBC: 4.1 10*3/uL (ref 4.0–10.5)

## 2016-05-22 MED ORDER — SODIUM CHLORIDE 0.9 % IV BOLUS (SEPSIS)
1000.0000 mL | Freq: Once | INTRAVENOUS | Status: AC
  Administered 2016-05-22: 1000 mL via INTRAVENOUS

## 2016-05-22 MED ORDER — ONDANSETRON HCL 4 MG/2ML IJ SOLN
4.0000 mg | Freq: Once | INTRAMUSCULAR | Status: AC
  Administered 2016-05-22: 4 mg via INTRAVENOUS
  Filled 2016-05-22: qty 2

## 2016-05-22 MED ORDER — DIAZEPAM 5 MG/ML IJ SOLN: 5.0000 mg | Freq: Once | INTRAMUSCULAR | Status: DC

## 2016-05-22 MED ORDER — IBUPROFEN 400 MG PO TABS
400.0000 mg | ORAL_TABLET | Freq: Once | ORAL | Status: AC
  Administered 2016-05-22: 400 mg via ORAL
  Filled 2016-05-22: qty 1

## 2016-05-22 NOTE — ED Notes (Signed)
Pt ambulated with very little dizziness, no other issues noted.
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
## 2016-05-22 NOTE — ED Notes (Signed)
MD at bedside. 

## 2016-05-22 NOTE — Discharge Instructions (Signed)
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
## 2016-06-17 ENCOUNTER — Other Ambulatory Visit: Payer: Self-pay | Admitting: Family Medicine

## 2016-06-17 DIAGNOSIS — S060X0D Concussion without loss of consciousness, subsequent encounter: Secondary | ICD-10-CM

## 2016-06-25 ENCOUNTER — Ambulatory Visit
Admission: RE | Admit: 2016-06-25 | Discharge: 2016-06-25 | Disposition: A | Discharge status: Home / Self Care | Payer: Medicare Other | Source: Ambulatory Visit | Attending: Family Medicine | Admitting: Family Medicine

## 2016-06-25 DIAGNOSIS — S060X0D Concussion without loss of consciousness, subsequent encounter: Secondary | ICD-10-CM

## 2016-06-25 MED ORDER — GADOBENATE DIMEGLUMINE 529 MG/ML IV SOLN
17.0000 mL | Freq: Once | INTRAVENOUS | Status: AC | PRN
Start: 2016-06-25 — End: 2016-06-25
  Administered 2016-06-25: 17 mL via INTRAVENOUS

## 2016-07-24 ENCOUNTER — Telehealth: Payer: Self-pay | Admitting: Nutrition

## 2016-07-24 NOTE — Telephone Encounter (Signed)
Wife called back after making the Reclast appt., and said that her husband said you said he will be getting "injections starting in January Twice a year, for the osteoporosis".  ??  Please let me know what you want done.

## 2016-07-29 NOTE — Telephone Encounter (Signed)
He needs to be given the Reclast now to replace the Fosamax he could not tolerate.  This is once a year and he needs to have this done now

## 2016-07-29 NOTE — Telephone Encounter (Signed)
PT called back in this morning, he is very confused because he said that "someone told him when he checked out that he is supposed to get this shot done in January."  I tried to explain to him that he is supposed to come in Wednesday for this appointment, but he did not understand.

## 2016-07-29 NOTE — Telephone Encounter (Signed)
Called patient but didn't answer and there was no answering machine

## 2016-07-30 NOTE — Telephone Encounter (Signed)
Appt

## 2016-07-30 NOTE — Telephone Encounter (Signed)
Bonita QuinLinda,  Can you call to schedule his reclast please?

## 2016-07-31 ENCOUNTER — Ambulatory Visit (INDEPENDENT_AMBULATORY_CARE_PROVIDER_SITE_OTHER): Payer: Medicare Other | Admitting: Endocrinology

## 2016-07-31 DIAGNOSIS — M810 Age-related osteoporosis without current pathological fracture: Secondary | ICD-10-CM | POA: Diagnosis not present

## 2016-07-31 NOTE — Patient Instructions (Signed)
Drink at least 4  8ounce glasses of water today. Continue to take your calcium and Vitamin D as directed

## 2016-07-31 NOTE — Assessment & Plan Note (Signed)
    Per Dr. Remus BlakeKumar's note on 05/20/16, and after the patient signed the consent, and IV of Normal saline was started in right arm with 18 g needle.  Once the IV was infusing,, Zolendronic acid, 5mg /14300ml was infused at 11:25 until 11:55AM.  The IV was flushed with normal saline and then removed.  The site showed no signes of redness or swelling.  He denied any symptoms of nausea, or light headedness while the IV was infusing, or afterwards.  He was encouraged to drink plenty of fluids today.  He had no final questions.

## 2016-08-12 ENCOUNTER — Encounter: Payer: Self-pay | Admitting: Neurology

## 2016-08-12 ENCOUNTER — Ambulatory Visit (INDEPENDENT_AMBULATORY_CARE_PROVIDER_SITE_OTHER): Payer: Medicare Other | Admitting: Neurology

## 2016-08-12 VITALS — BP 134/84 | HR 74 | Ht 64.0 in | Wt 185.0 lb

## 2016-08-12 DIAGNOSIS — S161XXD Strain of muscle, fascia and tendon at neck level, subsequent encounter: Secondary | ICD-10-CM | POA: Diagnosis not present

## 2016-08-12 DIAGNOSIS — G44309 Post-traumatic headache, unspecified, not intractable: Secondary | ICD-10-CM

## 2016-08-12 MED ORDER — GABAPENTIN 300 MG PO CAPS: 300.0000 mg | ORAL_CAPSULE | Freq: Every day | ORAL | 2 refills | Status: DC

## 2016-08-12 NOTE — Patient Instructions (Addendum)
1.  Start gabapentin 300mg  at bedtime.  2.  We will refer you to physical therapy for neck pain (which I feel is also contributing to the headaches) 3.  Stop Tylenol.  Instead, use ibuprofen but limit to no more than 2 days out of the week 4.  Follow up in 3 months but CONTACT ME IN 4 WEEKS WITH UPDATE and we can adjust dose of medication (gabapentin) if needed.

## 2016-08-12 NOTE — Progress Notes (Signed)
Chart forwarded.  
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
## 2016-08-14 ENCOUNTER — Telehealth: Payer: Self-pay | Admitting: Neurology

## 2016-08-14 DIAGNOSIS — S161XXD Strain of muscle, fascia and tendon at neck level, subsequent encounter: Secondary | ICD-10-CM

## 2016-08-14 NOTE — Telephone Encounter (Signed)
It's supposed to be taken daily as a preventative for headache (not to be used as abortive therapy).  However, if he tolerates it, he can take one tablet twice daily

## 2016-08-14 NOTE — Telephone Encounter (Signed)
Pt would like to know if he can take gabapentin during the day if he has a headache during the day. Please advise.

## 2016-08-14 NOTE — Telephone Encounter (Signed)
Patient needs to talk to someone about his medication he takes a bed time. He wants to know if he has a headache during the day can he take it please call [phone]

## 2016-08-15 NOTE — Telephone Encounter (Signed)
Message relayed to patient. Verbalized understanding and denied questions.   

## 2016-09-03 ENCOUNTER — Ambulatory Visit: Payer: Medicare Other | Admitting: Neurology

## 2016-09-10 ENCOUNTER — Ambulatory Visit: Payer: Medicare Other | Attending: Neurology | Admitting: Rehabilitative and Restorative Service Providers"

## 2016-09-10 DIAGNOSIS — M6281 Muscle weakness (generalized): Secondary | ICD-10-CM | POA: Insufficient documentation

## 2016-09-10 DIAGNOSIS — M542 Cervicalgia: Secondary | ICD-10-CM | POA: Diagnosis not present

## 2016-09-10 DIAGNOSIS — R293 Abnormal posture: Secondary | ICD-10-CM | POA: Insufficient documentation

## 2016-09-10 NOTE — Patient Instructions (Signed)
Extensors, Supine    Lie supine, head on small, rolled towel or one pillow.  Gently tuck chin and bring toward chest. Hold __5_ seconds. Repeat __10_ times per session. Do _2__ sessions per day.  Copyright  VHI. All rights reserved.   Shoulder Shrug    Bring shoulders up toward ears. Hold ___3_ seconds. Relax. Repeat __10__ times. Do __2__ sessions per day.  http://gt2.exer.us/12   Copyright  VHI. All rights reserved.   AROM: Lateral Neck Flexion    Slowly tilt head toward one shoulder, then the other.  Hold each position __5-10__ seconds. Repeat _3___ times to each side per set.  Do __2__ sessions per day.  http://orth.exer.us/296   Copyright  VHI. All rights reserved.   Pectoral Stretch    With arms behind doorjamb, gently lean forward. Stretch is felt across chest. Hold _20___ seconds. Repeat __3__ times. Do __2__ sessions per day.  http://[email or web address].exer.us/32   Copyright  VHI. All rights reserved.
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
## 2016-09-12 ENCOUNTER — Other Ambulatory Visit: Payer: Medicare Other

## 2016-09-17 ENCOUNTER — Ambulatory Visit: Payer: Medicare Other | Admitting: Endocrinology

## 2016-09-25 ENCOUNTER — Ambulatory Visit: Payer: Medicare Other | Attending: Neurology | Admitting: Rehabilitative and Restorative Service Providers"

## 2016-09-25 DIAGNOSIS — M6281 Muscle weakness (generalized): Secondary | ICD-10-CM | POA: Diagnosis present

## 2016-09-25 DIAGNOSIS — M542 Cervicalgia: Secondary | ICD-10-CM | POA: Diagnosis present

## 2016-09-25 DIAGNOSIS — R293 Abnormal posture: Secondary | ICD-10-CM | POA: Diagnosis present
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
## 2016-09-25 NOTE — Patient Instructions (Signed)
Extensors, Supine    Lie supine, head on small, rolled towel or one pillow.  Gently tuck chin and bring toward chest. Hold __5_ seconds. Repeat __10_ times per session. Do _2__ sessions per day.  Copyright  VHI. All rights reserved.   Shoulder Shrug    Bring shoulders up toward ears. Hold ___3_ seconds. Relax. Repeat __10__ times. Do __2__ sessions per day.  http://gt2.exer.us/12   Copyright  VHI. All rights reserved.   AROM: Lateral Neck Flexion    Slowly tilt head toward one shoulder, then the other.  Hold each position __5-10__ seconds. Repeat _3___ times to each side per set.  Do __2__ sessions per day.  http://orth.exer.us/296   Copyright  VHI. All rights reserved.   Pectoral Stretch    With arms behind doorjamb, gently lean forward. Stretch is felt across chest. Hold _20___ seconds. Repeat __3__ times. Do __2__ sessions per day.  http://gt2.exer.us/32   Copyright  VHI. All rights reserved.   ALTERNATE DAYS PERFORMING THE ABOVE EVERY OTHER DAY AND THE EXERCISES BELOW ON OPPOSITE DAYS.  Upper Limb Neural Tension: Median III    Stand with left palm flat on wall, fingers back. Bend elbow, side-bend head away and Hold _20__ seconds. Then straighten elbow and Hold __10__ seconds. Repeat __5__ times on each side.  Do __2__ sessions per day.  http://orth.exer.us/406   Copyright  VHI. All rights reserved.   Scapular Retraction: Abduction / Extension (Prone)    Lie with arms out from sides 90. Pinch shoulder blades together and raise arms a few inches from floor. Repeat __10__ times per set. Do __1__ sets per session. Do __2__ sessions per day.  http://orth.exer.us/958   Copyright  VHI. All rights reserved.   Horizontal Abduction (Resistive Band)    With arms at shoulder level, keep elbows straight. Using other arm as anchor, pull involved arm outward. Hold _3-5___ seconds. Repeat __10__ times. Do __2__ sessions per day.  Copyright  VHI.  All rights reserved.

## 2016-09-27 ENCOUNTER — Ambulatory Visit: Payer: Medicare Other | Admitting: Rehabilitative and Restorative Service Providers"

## 2016-09-27 DIAGNOSIS — M542 Cervicalgia: Secondary | ICD-10-CM

## 2016-09-27 DIAGNOSIS — M6281 Muscle weakness (generalized): Secondary | ICD-10-CM

## 2016-09-27 DIAGNOSIS — R293 Abnormal posture: Secondary | ICD-10-CM

## 2016-09-27 NOTE — Therapy (Signed)
[place] [address] [place], [place], [address] Phone: [phone]   Fax:  [phone]  Physical Therapy Treatment  Patient Details  Name: Stephen Garcia MRN: [id number] Date of Birth: 1941-09-11 Referring Provider: Metta Clines, DO  Encounter Date: 09/27/2016      PT End of Session - 09/27/16 0926    Visit Number 3   Number of Visits 8   Date for PT Re-Evaluation 10/11/16   Authorization Type G code every 10th visit   PT Start Time 0850   PT Stop Time 0930   PT Time Calculation (min) 40 min   Activity Tolerance Patient tolerated treatment well   Behavior During Therapy [facility] for tasks assessed/performed      Past Medical History:  Diagnosis Date  . Glaucoma   . Hyperlipidemia   . Hypothyroidism     Past Surgical History:  Procedure Laterality Date  . BACK SURGERY    . TONSILLECTOMY      There were no vitals filed for this visit.      Subjective Assessment - 09/27/16 0850    Subjective The patient reports no pain, describes it as "stiffness" and mild muscle soreness from working in yard.  He notes no headache toay.    Patient Stated Goals Reduce headaches and pain.   Currently in Pain? No/denies      THERAPEUTIC EXERCISE: Supine: Chin tucks with towel roll to encourage greater posterior lengthening x 15 reps  Quadriped: Rocking ant/posterior UE extension x 5 reps R and L sides with tactile cues scapular retraction UE/LE extension x 5 reps each side with cues for posture/balance  Seated: Shoulder shrugs x 5 reps Physioball sitting performing scapular retraction with shoulders at 90 deg with horizontal abduction, elbows at neutral with 90 deg flexion performing shoulder ER x 15 reps each with tactile cues  ROM: 63 deg rotation R and 66 deg rotation L  Arm bike x 2 minutes forward/2 minutes backwards emphasizing scapular stabilization.   MANUAL: Gentle soft tissue mobilization and suboccipital  release with manual traction supine for neck Passive overpressure into sidebending, rotation and flexion Patient c/o shoulder "tightness" and has limited IR joint mobility.  PT provided gentle manual distraction of R shoulder and end range Grade II capsule stretching/overpressure with significant relief immediately following and noted increased ROM (patient reports he cannot usually touch left shoulder with r hand due to pain/tightness).  SELF CARE/HOME MANAGEMENT: Discussed silver sneakers benefit and recommended patient check into local gym.         PT Long Term Goals - 09/27/16 4235      PT LONG TERM GOAL #1   Title The patient will be indep with HEP for neck ROM/flexibility, postural strengthening and shoulder strengthening HEP   Baseline Target date 10/10/2016   Time 4   Period Weeks     PT LONG TERM GOAL #2   Title The patient will reduce neck pain from 8/10 at rest down to 4/10 to demo improved self perception of limitations.   Baseline Met on 09/27/2016 reporting only mild stiffness, but no pain.    Time 4   Period Weeks   Status Achieved     PT LONG TERM GOAL #3   Title The aptient will improve neck rotation to 65 degrees R and L (from 56 deg R and 51 deg L)   Baseline Target date 10/10/2016- 63 deg R and 66 deg L 09/27/2016   Time 4   Period  Weeks   Status Partially Met     PT LONG TERM GOAL #4   Title The patient will improve R shoulder strength from 4/5 to > or equal to 5/5 for flexion and abduction.   Baseline Met on 09/27/2016   Time 4   Period Weeks   Status Achieved     PT LONG TERM GOAL #5   Title Further assess dizziness as indicated (gaze and positional movements)   Baseline Target date 10/10/2016   Time 4   Period Weeks               Plan - 09/27/16 1203    Clinical Impression Statement The patient notes neck pain reduced.  He does have right shoulder tightness/limited range per functional mobility that responded well today with manual  stretching/joint mobilization.  PT recommended patient contact gym re: silver sneakers to transition to community wellness for long term success.   PT Treatment/Interventions ADLs/Self Care Home Management;Therapeutic activities;Therapeutic exercise;Neuromuscular re-education;Passive range of motion;Patient/family education;Traction;Moist Heat;Cryotherapy;Manual techniques   PT Next Visit Plan Check HEP, R shoulder ROM as needed, check LTGs, plan d/c.   Consulted and Agree with Plan of Care Patient      Patient will benefit from skilled therapeutic intervention in order to improve the following deficits and impairments:  Dizziness, Decreased strength, Decreased range of motion, Postural dysfunction, Impaired flexibility, Improper body mechanics  Visit Diagnosis: Neck pain  Abnormal posture  Muscle weakness (generalized)     Problem List Patient Active Problem List   Diagnosis Date Noted  . Osteoporosis 09/10/2013  . Hypothyroidism 09/10/2013  . Upper airway cough syndrome complicated by cough syncope  03/18/2012  . Post-nasal drip 03/18/2012  . GERD (gastroesophageal reflux disease) 03/18/2012  . Cough syncope 03/18/2012    Halo Laski, PT 09/27/2016, 12:04 PM  [place] [address] [place], [place], [address] Phone: [phone]   Fax:  [phone]  Name: Stephen Garcia MRN: [id number] Date of Birth: 03-Jun-1941

## 2016-10-01 ENCOUNTER — Ambulatory Visit: Payer: Medicare Other | Admitting: Rehabilitative and Restorative Service Providers"

## 2016-10-02 ENCOUNTER — Ambulatory Visit: Payer: Medicare Other | Admitting: Rehabilitative and Restorative Service Providers"

## 2016-10-02 DIAGNOSIS — M542 Cervicalgia: Secondary | ICD-10-CM

## 2016-10-02 DIAGNOSIS — M6281 Muscle weakness (generalized): Secondary | ICD-10-CM

## 2016-10-02 DIAGNOSIS — R293 Abnormal posture: Secondary | ICD-10-CM
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
## 2016-10-07 ENCOUNTER — Ambulatory Visit: Payer: Medicare Other | Admitting: Rehabilitative and Restorative Service Providers"

## 2016-10-14 ENCOUNTER — Ambulatory Visit: Payer: Medicare Other | Admitting: Rehabilitative and Restorative Service Providers"

## 2016-11-12 ENCOUNTER — Other Ambulatory Visit: Payer: Self-pay | Admitting: Endocrinology

## 2016-11-12 DIAGNOSIS — M810 Age-related osteoporosis without current pathological fracture: Secondary | ICD-10-CM

## 2016-11-12 DIAGNOSIS — E039 Hypothyroidism, unspecified: Secondary | ICD-10-CM

## 2016-11-13 ENCOUNTER — Other Ambulatory Visit (INDEPENDENT_AMBULATORY_CARE_PROVIDER_SITE_OTHER): Payer: Medicare Other

## 2016-11-13 DIAGNOSIS — M810 Age-related osteoporosis without current pathological fracture: Secondary | ICD-10-CM | POA: Diagnosis not present

## 2016-11-13 LAB — COMPREHENSIVE METABOLIC PANEL
ALK PHOS: 37 U/L — AB (ref 39–117)
ALT: 20 U/L (ref 0–53)
AST: 19 U/L (ref 0–37)
Albumin: 4.4 g/dL (ref 3.5–5.2)
BILIRUBIN TOTAL: 0.5 mg/dL (ref 0.2–1.2)
BUN: 13 mg/dL (ref 6–23)
CALCIUM: 9.6 mg/dL (ref 8.4–10.5)
CO2: 31 mEq/L (ref 19–32)
CREATININE: 1.18 mg/dL (ref 0.40–1.50)
Chloride: 102 mEq/L (ref 96–112)
GFR: 76.98 mL/min (ref >60.00)
GLUCOSE: 110 mg/dL — AB (ref 70–99)
Potassium: 4 mEq/L (ref 3.5–5.1)
Sodium: 138 mEq/L (ref 135–145)
TOTAL PROTEIN: 7.4 g/dL (ref 6.0–8.3)

## 2016-11-13 LAB — VITAMIN D 25 HYDROXY (VIT D DEFICIENCY, FRACTURES): VITD: 31.66 ng/mL (ref 30.00–100.00)

## 2016-11-20 ENCOUNTER — Ambulatory Visit (INDEPENDENT_AMBULATORY_CARE_PROVIDER_SITE_OTHER): Payer: Medicare Other | Admitting: Endocrinology

## 2016-11-20 ENCOUNTER — Encounter: Payer: Self-pay | Admitting: Endocrinology

## 2016-11-20 VITALS — BP 118/66 | HR 71 | Ht 64.0 in | Wt 185.0 lb

## 2016-11-20 DIAGNOSIS — M818 Other osteoporosis without current pathological fracture: Secondary | ICD-10-CM

## 2016-11-20 DIAGNOSIS — E063 Autoimmune thyroiditis: Secondary | ICD-10-CM

## 2016-11-20 DIAGNOSIS — E038 Other specified hypothyroidism: Secondary | ICD-10-CM | POA: Diagnosis not present

## 2016-11-20 NOTE — Progress Notes (Signed)
Patient ID: Stephen Garcia, male   DOB: 24-Feb-1941, 76 y.o.   MRN: [id number]   Reason for Appointment:  followup visit  Chief complaint: Follow-up of osteoporosis   History of Present Illness:    OSTEOPOROSIS: He has known vertebral compression fracture of L2 on his MRI which was diagnosed on x-rays for nonspecific back pain Initial lumbar compression fractures of L1, L2 were noticed in 2011 on a routine x-ray   Also has had lumbar disc disease and has had chronic low back pain He does not think his back pain is any worse and has not had any episodes of low back pain  He has a bone density done previously indicating osteoporosis. Last T score was reportedly -3.0 in 2014 His last height was 5 feet 3.5 inches  Because of his osteoporosis and history of fractures he previously had been on Fosamax weekly since 2014  He was told by the pulmonologist to stop this because of potential for creating reflux.  He was then given an infusion of Reclast on 07/31/16 which he tolerated well    Vitamin D supplementation: He is taking 50,000 units on every other Sunday and the level was normal recently  Lab Results  Component Value Date   VD25OH 31.66 11/13/2016   VD25OH 29.83 (L) 09/18/2015   VD25OH 43.01 09/16/2014     HYPOTHYROIDISM: was first diagnosed  several years ago Unclear what his initial symptoms at diagnosis were Previously his TSH has been persistently low until 5/14 when it was 0.63 and his dose had been reduced   He feels fairly good without any unusual fatigue                For some time has been taking 75 g levothyroxine daily Compliance with the medical regimen has been as prescribed with taking the tablet in the morning before breakfast.  Last TSH done in 12/17 from his PCP was 0.45 His dose had been unchanged by the PCP   Lab Results  Component Value Date   TSH 0.57 09/18/2015   TSH 0.42 09/16/2014   FREET4 0.51 (L) 09/18/2015   FREET4 0.72 09/16/2014      Weight history:  Wt Readings from Last 3 Encounters:  11/20/16 185 lb (83.9 kg)  08/12/16 185 lb (83.9 kg)  05/22/16 186 lb (84.4 kg)        Allergies as of 11/20/2016   No Known Allergies     Medication List       Accurate as of 11/20/16 10:27 AM. Always use your most recent med list.          atorvastatin 20 MG tablet Commonly known as:  LIPITOR   dorzolamide 2 % ophthalmic solution Commonly known as:  TRUSOPT Place 1 drop into both eyes 3 (three) times daily.   ergocalciferol 50000 units capsule Commonly known as:  VITAMIN D2 Take 50,000 Units by mouth once a week.   gabapentin 300 MG capsule Commonly known as:  NEURONTIN Take 1 capsule (300 mg total) by mouth at bedtime.   levothyroxine 75 MCG tablet Commonly known as:  SYNTHROID, LEVOTHROID Take 75 mcg by mouth daily before breakfast.   montelukast 10 MG tablet Commonly known as:  SINGULAIR Take 1 tablet (10 mg total) by mouth at bedtime.       Allergies: No Known Allergies  Past Medical History:  Diagnosis Date  . Glaucoma   . Hyperlipidemia   . Hypothyroidism     Past Surgical  History:  Procedure Laterality Date  . BACK SURGERY    . TONSILLECTOMY      Family History  Problem Relation Age of Onset  . Heart disease Mother   . Asthma Sister     Social History:  reports that he has never smoked. He has never used smokeless tobacco. He reports that he drinks alcohol. He reports that he does not use drugs.  REVIEW Of SYSTEMS:  He is having Intermittent hoarseness  IMPAIRED fasting glucose:  His fasting glucose is 110, A1c was 6.1 done in December    Examination:   BP 118/66   Pulse 71   Ht 5\' 4"  (1.626 m)   Wt 185 lb (83.9 kg)   SpO2 98%   BMI 31.76 kg/m    His spine, which are his normal, no local tenderness    Assessment/Plan:    Osteoporosis. This is associated with asymptomatic vertebral fractures Because of potential for reflux with Fosamax he is taking  Reclast starting in 9/17 He has tolerated this well He is asymptomatic now Vitamin D level is normal  He will be scheduled for another infusion after his visit in August Also will need follow-up bone density assessment  Also needs vitamin D level assessment annually   Hypothyroidism taking 75 g supplement and clinically doing well: TSH normal in 12/17, generally has been low normal and followed by PCP Would recommend that he take a lower dose because of his age and potential for effects on osteoporosis  PREDIABETES: [facility] but he refuses now He will continue follow-up with PCP Emphasized need for weight loss  He will followup in 8 months  Stephen Garcia 11/20/2016, 10:27 AM

## 2016-11-22 ENCOUNTER — Ambulatory Visit (INDEPENDENT_AMBULATORY_CARE_PROVIDER_SITE_OTHER): Payer: Medicare Other | Admitting: Neurology

## 2016-11-22 ENCOUNTER — Encounter: Payer: Self-pay | Admitting: Neurology

## 2016-11-22 VITALS — BP 118/64 | HR 59 | Ht 64.0 in | Wt 186.5 lb

## 2016-11-22 DIAGNOSIS — G44309 Post-traumatic headache, unspecified, not intractable: Secondary | ICD-10-CM | POA: Diagnosis not present

## 2016-11-22 DIAGNOSIS — S161XXD Strain of muscle, fascia and tendon at neck level, subsequent encounter: Secondary | ICD-10-CM

## 2016-11-22 NOTE — Patient Instructions (Signed)
1.  You may take Tylenol or ibuprofen (if not contraindicated) when you get a headache.  Otherwise, you may take the gabapentin as needed.   2.  Remember not to take over the counter pain medications more than 2 days out of the week to prevent rebound headache 3.  Follow up as needed.
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
## 2017-02-15 NOTE — Addendum Note (Signed)
Addended by: Reather Littler on: 02/15/2017 04:49 PM   Modules accepted: Level of Service

## 2017-02-15 NOTE — Progress Notes (Signed)
Nurses note regarding Reclast infusion reviewed and accepted

## 2017-05-13 ENCOUNTER — Other Ambulatory Visit: Payer: Self-pay | Admitting: Family Medicine

## 2017-05-13 ENCOUNTER — Ambulatory Visit
Admission: RE | Admit: 2017-05-13 | Discharge: 2017-05-13 | Disposition: A | Discharge status: Home / Self Care | Payer: Medicare Other | Source: Ambulatory Visit | Attending: Family Medicine | Admitting: Family Medicine

## 2017-05-13 DIAGNOSIS — R05 Cough: Secondary | ICD-10-CM

## 2017-05-13 DIAGNOSIS — R059 Cough, unspecified: Secondary | ICD-10-CM

## 2017-06-17 ENCOUNTER — Other Ambulatory Visit (INDEPENDENT_AMBULATORY_CARE_PROVIDER_SITE_OTHER): Payer: Medicare Other

## 2017-06-17 DIAGNOSIS — M818 Other osteoporosis without current pathological fracture: Secondary | ICD-10-CM

## 2017-06-17 DIAGNOSIS — E063 Autoimmune thyroiditis: Secondary | ICD-10-CM

## 2017-06-17 LAB — COMPREHENSIVE METABOLIC PANEL
ALT: 14 U/L (ref 0–53)
AST: 17 U/L (ref 0–37)
Albumin: 4 g/dL (ref 3.5–5.2)
Alkaline Phosphatase: 39 U/L (ref 39–117)
BUN: 12 mg/dL (ref 6–23)
CALCIUM: 9.4 mg/dL (ref 8.4–10.5)
CHLORIDE: 104 meq/L (ref 96–112)
CO2: 27 mEq/L (ref 19–32)
CREATININE: 1.07 mg/dL (ref 0.40–1.50)
GFR: 86.05 mL/min (ref >60.00)
GLUCOSE: 107 mg/dL — AB (ref 70–99)
Potassium: 3.9 mEq/L (ref 3.5–5.1)
SODIUM: 137 meq/L (ref 135–145)
Total Bilirubin: 0.6 mg/dL (ref 0.2–1.2)
Total Protein: 7.2 g/dL (ref 6.0–8.3)

## 2017-06-17 LAB — T4, FREE: FREE T4: 0.8 ng/dL (ref 0.60–1.60)

## 2017-06-17 LAB — TSH: TSH: 0.5 u[IU]/mL (ref 0.35–4.50)

## 2017-06-20 ENCOUNTER — Ambulatory Visit: Payer: Medicare Other | Admitting: Endocrinology

## 2017-06-23 ENCOUNTER — Telehealth: Payer: Self-pay

## 2017-06-23 ENCOUNTER — Ambulatory Visit (INDEPENDENT_AMBULATORY_CARE_PROVIDER_SITE_OTHER): Payer: Medicare Other | Admitting: Endocrinology

## 2017-06-23 ENCOUNTER — Encounter: Payer: Self-pay | Admitting: Endocrinology

## 2017-06-23 ENCOUNTER — Telehealth: Payer: Self-pay | Admitting: Endocrinology

## 2017-06-23 VITALS — BP 124/74 | HR 79 | Ht 64.0 in | Wt 181.8 lb

## 2017-06-23 DIAGNOSIS — E559 Vitamin D deficiency, unspecified: Secondary | ICD-10-CM | POA: Diagnosis not present

## 2017-06-23 DIAGNOSIS — E063 Autoimmune thyroiditis: Secondary | ICD-10-CM

## 2017-06-23 DIAGNOSIS — M810 Age-related osteoporosis without current pathological fracture: Secondary | ICD-10-CM | POA: Diagnosis not present

## 2017-06-23 MED ORDER — LEVOTHYROXINE SODIUM 125 MCG PO TABS: 62.5000 ug | ORAL_TABLET | Freq: Every day | ORAL | 3 refills | Status: DC

## 2017-06-23 NOTE — Telephone Encounter (Signed)
Are you prescribing the same dosage of Levothyroxine? Please advise.

## 2017-06-23 NOTE — Telephone Encounter (Signed)
This has been sent as a new prescription

## 2017-06-23 NOTE — Telephone Encounter (Signed)
Called [facility] office to schedule a Bone Density test for this Thursday, August 16th at 10:30am and can call [phone] to reschedule if he needs to. Called patient to let him know [facility] [address] and also gave Corrie DandyMary his wife the appointment time for his bone density test.

## 2017-06-23 NOTE — Patient Instructions (Signed)
[facility]

## 2017-06-23 NOTE — Progress Notes (Signed)
Patient ID: Stephen Garcia, male   DOB: 12/10/1940, 76 y.o.   MRN: [id number]   Reason for Appointment:  followup visit     History of Present Illness:    OSTEOPOROSIS: He has known vertebral compression fracture of L2 on his MRI which was diagnosed on x-rays for nonspecific back pain Initial lumbar compression fractures of L1, L2 were noticed in 2011 on a routine x-ray   Also has had lumbar disc disease and has had chronic low back pain He does not think his back pain is any worse and has not had any episodes of low back pain  He has a bone density done previously indicating osteoporosis. Last T score was reportedly -3.0 in 2014 Not clear if he has had another bone density done by PCP since then   Because of his osteoporosis and history of fractures he previously had been on Fosamax weekly since 2014   He was told by the pulmonologist to stop this because of potential for creating reflux.  He was then given an infusion of Reclast on 07/31/16 which he tolerated well  His last height was 5 feet 3.5 inches  Vitamin D supplementation: He is taking 50,000 units on every other Sunday and the level was normal in 1/18  Lab Results  Component Value Date   VD25OH 31.66 11/13/2016   VD25OH 29.83 (L) 09/18/2015   VD25OH 43.01 09/16/2014     HYPOTHYROIDISM: was first diagnosed  several years ago Unclear what his initial symptoms at diagnosis were  He feels fairly good without any unusual fatigue                For some time has been taking 75 g levothyroxine daily He takes this before breakfast daily consistently  His dose had been unchanged by the PCP but his TSH continues to be low normal Only once his free T4 has been documented as low   Lab Results  Component Value Date   TSH 0.50 06/17/2017   TSH 0.57 09/18/2015   TSH 0.42 09/16/2014   FREET4 0.80 06/17/2017   FREET4 0.51 (L) 09/18/2015   FREET4 0.72 09/16/2014       Allergies as of 06/23/2017   No Known  Allergies     Medication List       Accurate as of 06/23/17 10:19 AM. Always use your most recent med list.          ALPHAGAN P 0.1 % Soln Generic drug:  brimonidine Place 1 drop into both eyes 2 times daily.   atorvastatin 20 MG tablet Commonly known as:  LIPITOR   dorzolamide 2 % ophthalmic solution Commonly known as:  TRUSOPT Place 1 drop into both eyes 3 (three) times daily.   dorzolamide-timolol 22.3-6.8 MG/ML ophthalmic solution Commonly known as:  COSOPT Place 1 drop into both eyes every 12 hours.   ergocalciferol 50000 units capsule Commonly known as:  VITAMIN D2 Take 50,000 Units by mouth once a week.   levothyroxine 75 MCG tablet Commonly known as:  SYNTHROID, LEVOTHROID Take 75 mcg by mouth daily before breakfast.   LUMIGAN 0.01 % Soln Generic drug:  bimatoprost   montelukast 10 MG tablet Commonly known as:  SINGULAIR Take 1 tablet (10 mg total) by mouth at bedtime.       Allergies: No Known Allergies  Past Medical History:  Diagnosis Date  . Glaucoma   . Hyperlipidemia   . Hypothyroidism     Past Surgical History:  Procedure  Laterality Date  . BACK SURGERY    . TONSILLECTOMY      Family History  Problem Relation Age of Onset  . Heart disease Mother   . Asthma Sister     Social History:  reports that he has never smoked. He has never used smokeless tobacco. He reports that he drinks alcohol. He reports that he does not use drugs.  REVIEW Of SYSTEMS:    IMPAIRED fasting glucose:  His fasting glucose is 107  A1c was 6.1 done in December by his PCP He has not started any exercise regimen as discussed previously  No results found for: HGBA1C Lab Results  Component Value Date   CREATININE 1.07 06/17/2017     Wt Readings from Last 3 Encounters:  06/23/17 181 lb 12.8 oz (82.5 kg)  11/22/16 186 lb 8 oz (84.6 kg)  11/20/16 185 lb (83.9 kg)      Examination:   BP 124/74   Pulse 79   Ht 5\' 4"  (1.626 m)   Wt 181 lb 12.8 oz  (82.5 kg)   SpO2 97%   BMI 31.21 kg/m        Assessment/Plan:    Osteoporosis. This is associated with asymptomatic vertebral fractures Because of potential for reflux with Fosamax he is taking Reclast starting in 9/17 He has tolerated this well Vitamin D level Will be rechecked on the next visit  He will be scheduled for another infusion next month Also will need follow-up bone density assessment and this will be scheduled Discussed continuing Reclast because of his significant osteoporosis associated with fractures  Also needs vitamin D level assessment annually   Hypothyroidism taking 75 g supplement and clinically doing well:  TSH continues to be low normal Would recommend that he take a lower dose because of his age and potential for effects on osteoporosis; new prescription for 62.5 g has been sent, he will take a half of the 125 doses   PREDIABETES: Again discussed that his impaired fasting glucose has not improved much He will need an A1c on the next visit   Emphasized need for weight loss and he will need to start an exercise program, most likely he'll do better with [facility] an [facility] facility  [facility]  He will followup in 4 months  Shane Badeaux 06/23/2017, 10:19 AM

## 2017-06-23 NOTE — Telephone Encounter (Signed)
Patient called to advise that levothyroxine was supposed to be sent in. Noting indicating at this point. [facility] [address] on file attached above.

## 2017-06-23 NOTE — Telephone Encounter (Signed)
Called patient and spoke to his wife to let her know that [facility] [address]

## 2017-06-26 ENCOUNTER — Ambulatory Visit (INDEPENDENT_AMBULATORY_CARE_PROVIDER_SITE_OTHER)
Admission: RE | Admit: 2017-06-26 | Discharge: 2017-06-26 | Disposition: A | Discharge status: Home / Self Care | Payer: Medicare Other | Source: Ambulatory Visit | Attending: Endocrinology | Admitting: Endocrinology

## 2017-06-26 DIAGNOSIS — M810 Age-related osteoporosis without current pathological fracture: Secondary | ICD-10-CM | POA: Diagnosis not present

## 2017-06-30 NOTE — Progress Notes (Signed)
Please call to let patient know that t bone density is improved and we can do his Reclast every other year now

## 2017-09-01 ENCOUNTER — Ambulatory Visit (INDEPENDENT_AMBULATORY_CARE_PROVIDER_SITE_OTHER): Payer: Medicare Other | Admitting: Endocrinology

## 2017-09-01 DIAGNOSIS — M818 Other osteoporosis without current pathological fracture: Secondary | ICD-10-CM

## 2017-09-01 DIAGNOSIS — M810 Age-related osteoporosis without current pathological fracture: Secondary | ICD-10-CM

## 2017-09-02 NOTE — Progress Notes (Signed)
Per office note on 11/20/16, and after the patient signed the consent, an IV of Normal saline was started in right arm with 21 g needle. Once the IV was determined to be patent, the Zolendronic acid, 5mg /17600ml was started at 10: 20AM: until 10:45 AM. The IV was flushed with normal saline and then removed. The site showed no signes of redness or swelling. He denied any symptoms of nausea, or light headedness while the IV was infusing, or afterwards. He was encouraged to drink plenty of fluids today, and to continue his calcium and vitamen D.  He agreed to do this and had no final questions.  The above note was reviewed and approved  KUMAR,AJAY

## 2017-09-02 NOTE — Patient Instructions (Signed)
Continue to take your Calcium and Vitamen D as directed Drink plenty of Fluids today--at least 4  8ounce glases.

## 2017-10-23 ENCOUNTER — Ambulatory Visit: Payer: Medicare Other | Admitting: Endocrinology
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
## 2017-11-07 ENCOUNTER — Encounter: Payer: Self-pay | Admitting: Endocrinology

## 2017-11-07 ENCOUNTER — Ambulatory Visit (INDEPENDENT_AMBULATORY_CARE_PROVIDER_SITE_OTHER): Payer: Medicare Other | Admitting: Endocrinology

## 2017-11-07 VITALS — Ht 64.0 in | Wt 187.2 lb

## 2017-11-07 DIAGNOSIS — M818 Other osteoporosis without current pathological fracture: Secondary | ICD-10-CM | POA: Diagnosis not present

## 2017-11-07 DIAGNOSIS — E063 Autoimmune thyroiditis: Secondary | ICD-10-CM

## 2017-11-07 NOTE — Patient Instructions (Signed)
[facility] [employer]

## 2017-11-07 NOTE — Progress Notes (Signed)
Patient ID: Stephen Garcia, male   DOB: 01/02/1941, 76 y.o.   MRN: [id number]   Reason for Appointment:  followup visit     History of Present Illness:    OSTEOPOROSIS: He has known vertebral compression fracture of L2 on his MRI which was diagnosed on x-rays for nonspecific back pain Initial lumbar compression fractures of L1, L2 were noticed in 2011 on a routine x-ray   Also has had lumbar disc disease and has had chronic low back pain He does not think his back pain is any worse and has not had any episodes of low back pain  He has a bone density done at baseline indicating osteoporosis.  T score was reportedly -3.0 in 2014  Because of his osteoporosis and history of fractures he previously had been on Fosamax weekly since 2014   He was told by the pulmonologist to stop this because of potential for creating reflux.  He was then given an infusion of Reclast on 07/31/16 which he tolerated well   Repeat bone density: Results from 06/26/17 showed T score at lumbar spine -1.7 Left femoral neck -2.4  Vitamin D supplementation: He is taking 50,000 units on every other Sunday and the level was normal in 1/18  Lab Results  Component Value Date   VD25OH 31.66 11/13/2016   VD25OH 29.83 (L) 09/18/2015   VD25OH 43.01 09/16/2014     HYPOTHYROIDISM: was first diagnosed  several years ago Unclear what his initial symptoms at diagnosis were                 For some time has been taking 75 g levothyroxine daily However because of his age and low normal TSH level he has been now taking a half tablet of the 125 g daily since 06/2017  He takes this before breakfast daily consistently  He has been feeling fairly good with no unusual fatigue or cold intolerance, no weight change However he has not gone for his labs   Lab Results  Component Value Date   TSH 0.50 06/17/2017   TSH 0.57 09/18/2015   TSH 0.42 09/16/2014   FREET4 0.80 06/17/2017   FREET4 0.51 (L) 09/18/2015   FREET4 0.72 09/16/2014       Allergies as of 11/07/2017   No Known Allergies     Medication List        Accurate as of 11/07/17 11:59 PM. Always use your most recent med list.          ALPHAGAN P 0.1 % Soln Generic drug:  brimonidine Place 1 drop into both eyes 2 times daily.   atorvastatin 20 MG tablet Commonly known as:  LIPITOR   dorzolamide 2 % ophthalmic solution Commonly known as:  TRUSOPT Place 1 drop into both eyes 3 (three) times daily.   dorzolamide-timolol 22.3-6.8 MG/ML ophthalmic solution Commonly known as:  COSOPT Place 1 drop into both eyes every 12 hours.   ergocalciferol 50000 units capsule Commonly known as:  VITAMIN D2 Take 50,000 Units by mouth once a week.   levothyroxine 125 MCG tablet Commonly known as:  SYNTHROID, LEVOTHROID Take 0.5 tablets (62.5 mcg total) by mouth daily.   LUMIGAN 0.01 % Soln Generic drug:  bimatoprost       Allergies: No Known Allergies  Past Medical History:  Diagnosis Date  . Glaucoma   . Hyperlipidemia   . Hypothyroidism     Past Surgical History:  Procedure Laterality Date  . BACK SURGERY    .  TONSILLECTOMY      Family History  Problem Relation Age of Onset  . Heart disease Mother   . Asthma Sister     Social History:  reports that  has never smoked. he has never used smokeless tobacco. He reports that he drinks alcohol. He reports that he does not use drugs.  REVIEW Of SYSTEMS:    IMPAIRED fasting glucose:  His fasting glucose is 107  A1c was 6.1 done in December by his PCP He has not started any exercise regimen as discussed previously  No results found for: HGBA1C Lab Results  Component Value Date   CREATININE 1.07 06/17/2017     Wt Readings from Last 3 Encounters:  11/07/17 187 lb 3.2 oz (84.9 kg)  06/23/17 181 lb 12.8 oz (82.5 kg)  11/22/16 186 lb 8 oz (84.6 kg)      Examination:   Ht 5\' 4"  (1.626 m)   Wt 187 lb 3.2 oz (84.9 kg)   BMI 32.13 kg/m         Assessment/Plan:    Osteoporosis. This is associated with asymptomatic vertebral fractures His baseline bone density was -3.0 With taking Reclast his last bone density showed the lowest T score to be -2.4 No further recurrences of fractures or back pain  For now he can start taking Reclast every other year since she has only osteopenia at this time  Also needs vitamin D level assessment    Hypothyroidism taking low dose supplements for some time Not taking 62.5 g Needs follow-up labs and he will go for this, subjectively doing well currently   PREDIABETES: Discussed need for exercise and weight loss, he thinks he can start going to the gym He will also try to work with his diet and discuss improving this with his wife    [name] 11/09/2017, 9:37 PM

## 2017-11-09 ENCOUNTER — Encounter: Payer: Self-pay | Admitting: Endocrinology

## 2017-11-14 ENCOUNTER — Telehealth: Payer: Self-pay | Admitting: Endocrinology

## 2017-11-14 ENCOUNTER — Other Ambulatory Visit (INDEPENDENT_AMBULATORY_CARE_PROVIDER_SITE_OTHER): Payer: Medicare Other

## 2017-11-14 DIAGNOSIS — E559 Vitamin D deficiency, unspecified: Secondary | ICD-10-CM

## 2017-11-14 DIAGNOSIS — E063 Autoimmune thyroiditis: Secondary | ICD-10-CM

## 2017-11-14 DIAGNOSIS — M810 Age-related osteoporosis without current pathological fracture: Secondary | ICD-10-CM | POA: Diagnosis not present

## 2017-11-14 LAB — COMPREHENSIVE METABOLIC PANEL
ALT: 18 U/L (ref 0–53)
AST: 19 U/L (ref 0–37)
Albumin: 4.3 g/dL (ref 3.5–5.2)
Alkaline Phosphatase: 37 U/L — ABNORMAL LOW (ref 39–117)
BILIRUBIN TOTAL: 0.8 mg/dL (ref 0.2–1.2)
BUN: 10 mg/dL (ref 6–23)
CO2: 29 meq/L (ref 19–32)
Calcium: 9.1 mg/dL (ref 8.4–10.5)
Chloride: 102 mEq/L (ref 96–112)
Creatinine, Ser: 1.08 mg/dL (ref 0.40–1.50)
GFR: 85.04 mL/min (ref >60.00)
GLUCOSE: 101 mg/dL — AB (ref 70–99)
Potassium: 3.9 mEq/L (ref 3.5–5.1)
SODIUM: 137 meq/L (ref 135–145)
Total Protein: 7.2 g/dL (ref 6.0–8.3)

## 2017-11-14 LAB — VITAMIN D 25 HYDROXY (VIT D DEFICIENCY, FRACTURES): VITD: 25.93 ng/mL — ABNORMAL LOW (ref 30.00–100.00)

## 2017-11-14 LAB — TSH: TSH: 0.76 u[IU]/mL (ref 0.35–4.50)

## 2017-11-14 LAB — T4, FREE: Free T4: 0.5 ng/dL — ABNORMAL LOW (ref 0.60–1.60)

## 2017-11-14 NOTE — Telephone Encounter (Signed)
Patient is having some confusion with his appointment, please call him he asked

## 2017-11-14 NOTE — Telephone Encounter (Signed)
I contacted the patient and advised via voicemail Dr. Lucianne MussKumar would like for him to follow up in 1 year. Patient advised to call back and discuss further if need be.

## 2017-12-05 ENCOUNTER — Other Ambulatory Visit: Payer: Self-pay | Admitting: Endocrinology

## 2018-04-27 ENCOUNTER — Other Ambulatory Visit: Payer: Self-pay | Admitting: Endocrinology

## 2018-04-27 NOTE — Telephone Encounter (Signed)
Refill

## 2018-06-23 ENCOUNTER — Ambulatory Visit (INDEPENDENT_AMBULATORY_CARE_PROVIDER_SITE_OTHER): Payer: Medicare Other

## 2018-06-23 ENCOUNTER — Ambulatory Visit: Payer: Medicare Other | Admitting: Podiatry

## 2018-06-23 ENCOUNTER — Encounter: Payer: Self-pay | Admitting: Podiatry

## 2018-06-23 DIAGNOSIS — M205X1 Other deformities of toe(s) (acquired), right foot: Secondary | ICD-10-CM

## 2018-06-23 DIAGNOSIS — M779 Enthesopathy, unspecified: Secondary | ICD-10-CM

## 2018-06-23 MED ORDER — DICLOFENAC SODIUM 1 % TD GEL: 2.0000 g | Freq: Four times a day (QID) | TRANSDERMAL | 2 refills | Status: DC

## 2018-06-23 NOTE — Progress Notes (Signed)
   Subjective:    Patient ID: Stephen LocusJames A Leinbach, male    DOB: 07/11/1941, 77 y.o.   MRN: [id number]  HPI 77 year old male presents the office today with concerns of pain to the right big toe joint which is been ongoing for greater than 1 year.  He states that hurts with certain shoes.  He denies any recent injury or trauma.  He occasionally states that the area will swell but denies any redness and denies any history of gout.  No recent injury.  He thinks he may have arthritis in the toe.  He has no other concerns.   Review of Systems  All other systems reviewed and are negative.  Past Medical History:  Diagnosis Date  . Glaucoma   . Hyperlipidemia   . Hypothyroidism     Past Surgical History:  Procedure Laterality Date  . BACK SURGERY    . TONSILLECTOMY       Current Outpatient Medications:  .  atorvastatin (LIPITOR) 20 MG tablet, , Disp: , Rfl:  .  brimonidine (ALPHAGAN P) 0.1 % SOLN, Place 1 drop into both eyes 2 times daily., Disp: , Rfl:  .  diclofenac sodium (VOLTAREN) 1 % GEL, Apply 2 g topically 4 (four) times daily. Rub into affected area of foot 2 to 4 times daily, Disp: 100 g, Rfl: 2 .  dorzolamide (TRUSOPT) 2 % ophthalmic solution, Place 1 drop into both eyes 3 (three) times daily., Disp: , Rfl:  .  ergocalciferol (VITAMIN D2) 50000 UNITS capsule, Take 50,000 Units by mouth once a week., Disp: , Rfl:  .  levothyroxine (SYNTHROID, LEVOTHROID) 125 MCG tablet, TAKE 1/2 (ONE-HALF) TABLET BY MOUTH ONCE DAILY, Disp: 15 tablet, Rfl: 3 .  LUMIGAN 0.01 % SOLN, , Disp: , Rfl:   No Known Allergies       Objective:   Physical Exam  General: AAO x3, NAD  Dermatological: Skin is warm, dry and supple bilateral. Nails x 10 are well manicured; remaining integument appears unremarkable at this time. There are no open sores, no preulcerative lesions, no rash or signs of infection present.  Vascular: Dorsalis Pedis artery and Posterior Tibial artery pedal pulses are 2/4 bilateral with  immedate capillary fill time. There is no pain with calf compression, swelling, warmth, erythema.   Neruologic: Grossly intact via light touch bilateral.  Protective threshold with Semmes Wienstein monofilament intact to all pedal sites bilateral.   Musculoskeletal: Decreased range of motion of the first MPJ on the right foot.  Mild crepitation with MPJ range of motion.  Bunion deformities present clinically.  There is subjective discomfort in the range of motion however he is not expensing significant discomfort today.  There is trace edema there is no erythema or increase in warmth.  No other areas of tenderness.  Gait: Unassisted, Nonantalgic.     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  Arthritic changes present the first MPJ bunion deformities present.  No evidence of acute fracture identified. -Discussed steroid injection he wishes to hold off on this.  Prescribed Voltaren gel.  Discussed different soled shoes and also a graphite insert dispensed for his everyday shoe but try to wear a stiffer soled shoe.  Vivi BarrackMatthew R Wagoner DPM

## 2018-06-24 DIAGNOSIS — M205X1 Other deformities of toe(s) (acquired), right foot: Secondary | ICD-10-CM | POA: Insufficient documentation

## 2018-09-16 ENCOUNTER — Other Ambulatory Visit: Payer: Self-pay | Admitting: Endocrinology

## 2018-09-24 IMAGING — DX DG CHEST 2V
2 series · 2 of 2 positions shown · non-contrast
Comparison: Chest x-ray of 10/10/2015

CLINICAL DATA: Cough for 1 month

EXAM:
CHEST  2 VIEW

[dg chest 2 view (1 of 2)]
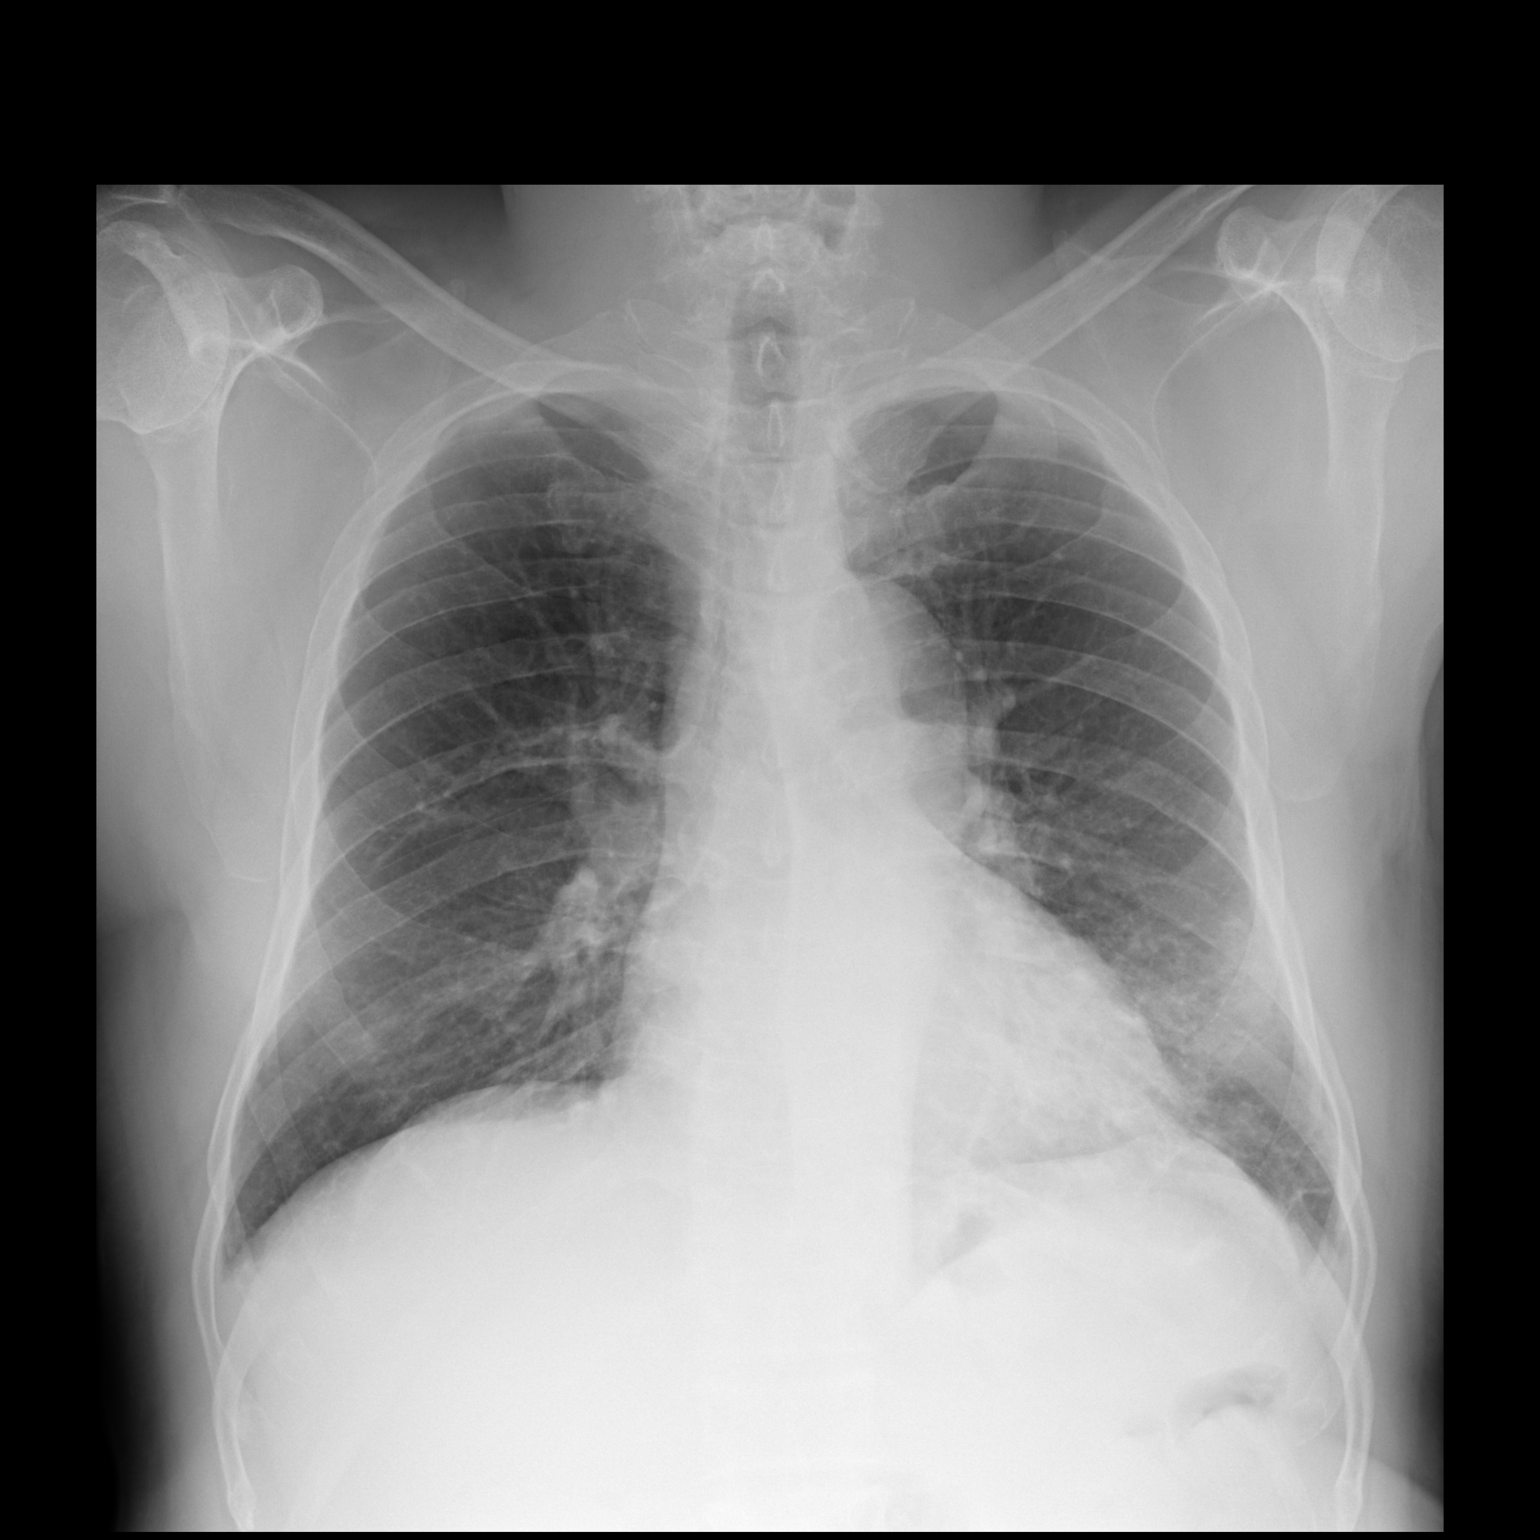

[dg chest 2 view (2 of 2)]
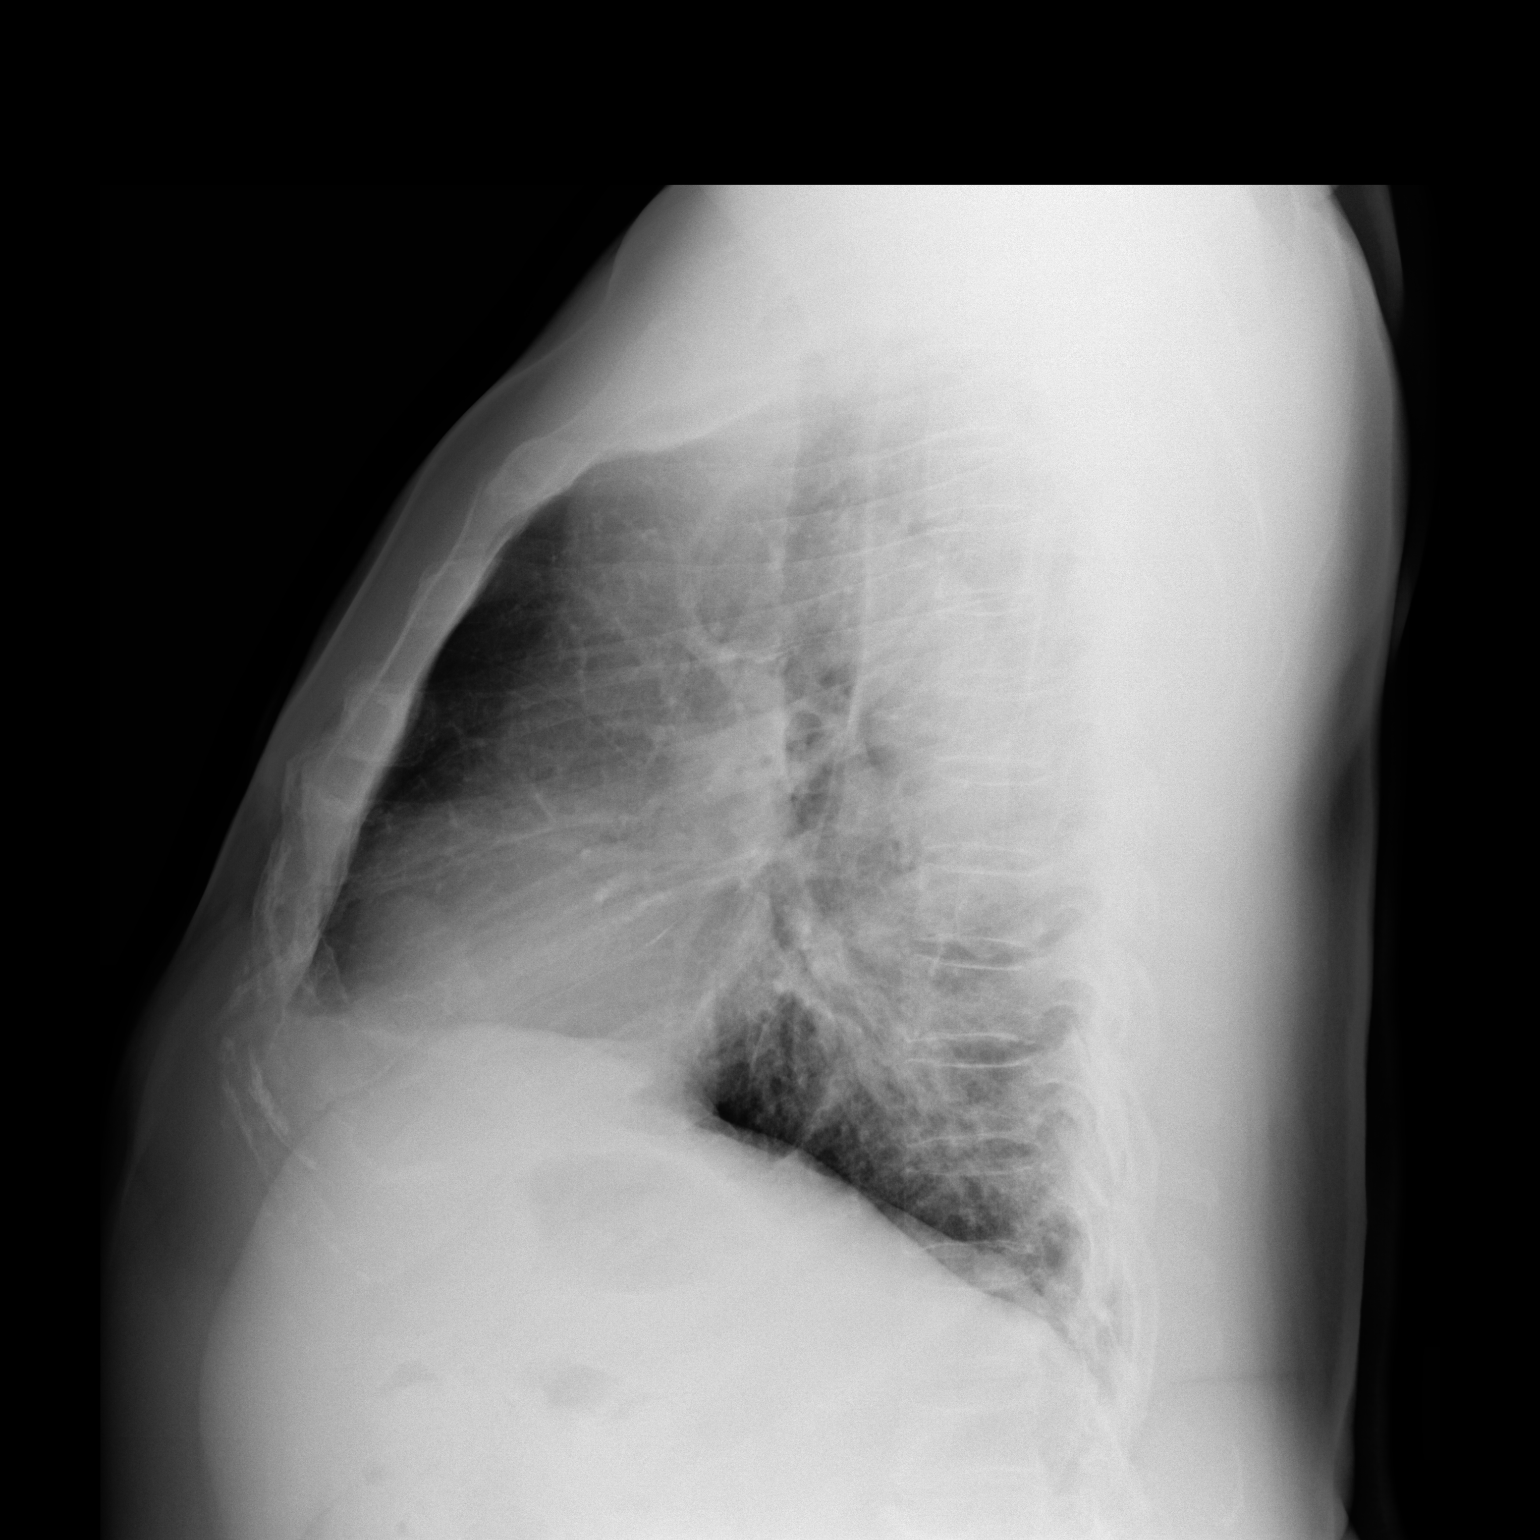

[2 of 2 positions shown; findings below may reference images not displayed]

FINDINGS: No pneumonia or effusion is seen. Mediastinal and hilar contours are
unremarkable. The heart is within normal limits in size. No bony
abnormality is seen.
IMPRESSION: No active cardiopulmonary disease.

## 2018-11-08 ENCOUNTER — Other Ambulatory Visit: Payer: Self-pay | Admitting: Endocrinology

## 2018-11-08 DIAGNOSIS — M810 Age-related osteoporosis without current pathological fracture: Secondary | ICD-10-CM

## 2018-11-08 DIAGNOSIS — E063 Autoimmune thyroiditis: Secondary | ICD-10-CM

## 2018-11-08 DIAGNOSIS — E559 Vitamin D deficiency, unspecified: Secondary | ICD-10-CM

## 2018-11-09 ENCOUNTER — Other Ambulatory Visit (INDEPENDENT_AMBULATORY_CARE_PROVIDER_SITE_OTHER): Payer: Medicare Other

## 2018-11-09 ENCOUNTER — Ambulatory Visit: Payer: Medicare Other | Admitting: Endocrinology

## 2018-11-09 DIAGNOSIS — E559 Vitamin D deficiency, unspecified: Secondary | ICD-10-CM

## 2018-11-09 DIAGNOSIS — M810 Age-related osteoporosis without current pathological fracture: Secondary | ICD-10-CM

## 2018-11-09 DIAGNOSIS — E063 Autoimmune thyroiditis: Secondary | ICD-10-CM

## 2018-11-09 LAB — VITAMIN D 25 HYDROXY (VIT D DEFICIENCY, FRACTURES): VITD: 50.21 ng/mL (ref 30.00–100.00)

## 2018-11-09 LAB — COMPREHENSIVE METABOLIC PANEL
ALBUMIN: 4.2 g/dL (ref 3.5–5.2)
ALT: 9 U/L (ref 0–53)
AST: 16 U/L (ref 0–37)
Alkaline Phosphatase: 39 U/L (ref 39–117)
BILIRUBIN TOTAL: 0.6 mg/dL (ref 0.2–1.2)
BUN: 13 mg/dL (ref 6–23)
CO2: 26 meq/L (ref 19–32)
Calcium: 9.3 mg/dL (ref 8.4–10.5)
Chloride: 104 mEq/L (ref 96–112)
Creatinine, Ser: 1.14 mg/dL (ref 0.40–1.50)
GFR: 79.69 mL/min (ref >60.00)
Glucose, Bld: 95 mg/dL (ref 70–99)
Potassium: 4 mEq/L (ref 3.5–5.1)
Sodium: 136 mEq/L (ref 135–145)
Total Protein: 7 g/dL (ref 6.0–8.3)

## 2018-11-09 LAB — TSH: TSH: 0.75 u[IU]/mL (ref 0.35–4.50)

## 2018-11-09 LAB — T4, FREE: FREE T4: 0.62 ng/dL (ref 0.60–1.60)

## 2018-11-17 ENCOUNTER — Encounter: Payer: Self-pay | Admitting: Endocrinology

## 2018-11-17 ENCOUNTER — Ambulatory Visit: Payer: Medicare Other | Admitting: Endocrinology

## 2018-11-17 VITALS — BP 130/74 | HR 57 | Ht 64.0 in | Wt 184.2 lb

## 2018-11-17 DIAGNOSIS — M810 Age-related osteoporosis without current pathological fracture: Secondary | ICD-10-CM | POA: Diagnosis not present

## 2018-11-17 DIAGNOSIS — E559 Vitamin D deficiency, unspecified: Secondary | ICD-10-CM

## 2018-11-17 DIAGNOSIS — E063 Autoimmune thyroiditis: Secondary | ICD-10-CM | POA: Diagnosis not present

## 2018-11-17 NOTE — Patient Instructions (Signed)
[facility]

## 2018-11-17 NOTE — Progress Notes (Signed)
Patient ID: Stephen Garcia, male   DOB: June 08, 1941, 78 y.o.   MRN: [id number]   Reason for Appointment:  followup visit     History of Present Illness:    OSTEOPOROSIS: He has known vertebral compression fracture of L2 on his MRI which was diagnosed on x-rays for nonspecific back pain Initial lumbar compression fractures of L1, L2 were noticed in 2011 on a routine x-ray   Also has had lumbar disc disease and has had chronic low back pain He does not think his back pain is any worse and has not had any episodes of low back pain  He has a bone density done at baseline indicating osteoporosis.  T score was reportedly -3.0 in 2014  Because of his osteoporosis and history of fractures he previously had been on Fosamax weekly since 2014  He was told by the pulmonologist to stop this because of potential for creating reflux.  He was then given an infusion of Reclast on 07/31/16, which he tolerated well He did not come back for follow-up for his second infusion last year  Repeat bone density: Results from 06/26/17 showed T score at lumbar spine -1.7 Left femoral neck -2.4  Vitamin D supplementation: He is taking 50,000 units on every Sunday and the level was normal as of 12/19  Lab Results  Component Value Date   VD25OH 50.21 11/09/2018   VD25OH 25.93 (L) 11/14/2017   VD25OH 31.66 11/13/2016   VD25OH 29.83 (L) 09/18/2015     HYPOTHYROIDISM: was first diagnosed  several years ago Unclear what his initial symptoms at diagnosis were                Previously  had been taking 75 g levothyroxine daily However because of his age and low normal TSH level he has been taking a half tablet of the 125 g daily since 06/2017  He takes this before breakfast daily regularly  He has not had any unusual fatigue recently No unusual cold intolerance Thyroid levels again normal   Lab Results  Component Value Date   TSH 0.75 11/09/2018   TSH 0.76 11/14/2017   TSH 0.50 06/17/2017   FREET4 0.62 11/09/2018   FREET4 0.50 (L) 11/14/2017   FREET4 0.80 06/17/2017       Allergies as of 11/17/2018   No Known Allergies     Medication List       Accurate as of November 17, 2018  8:39 AM. Always use your most recent med list.        ALPHAGAN P 0.1 % Soln Generic drug:  brimonidine Place 1 drop into both eyes 2 times daily.   atorvastatin 20 MG tablet Commonly known as:  LIPITOR   diclofenac sodium 1 % Gel Commonly known as:  VOLTAREN Apply 2 g topically 4 (four) times daily. Rub into affected area of foot 2 to 4 times daily   dorzolamide 2 % ophthalmic solution Commonly known as:  TRUSOPT Place 1 drop into both eyes 3 (three) times daily.   ergocalciferol 1.25 MG (50000 UT) capsule Commonly known as:  VITAMIN D2 Take 50,000 Units by mouth once a week.   levothyroxine 125 MCG tablet Commonly known as:  SYNTHROID, LEVOTHROID TAKE 1/2 (ONE-HALF) TABLET BY MOUTH ONCE DAILY   LUMIGAN 0.01 % Soln Generic drug:  bimatoprost       Allergies: No Known Allergies  Past Medical History:  Diagnosis Date  . Glaucoma   . Hyperlipidemia   . Hypothyroidism  Past Surgical History:  Procedure Laterality Date  . BACK SURGERY    . TONSILLECTOMY      Family History  Problem Relation Age of Onset  . Heart disease Mother   . Asthma Sister     Social History:  reports that he has never smoked. He has never used smokeless tobacco. He reports current alcohol use. He reports that he does not use drugs.  REVIEW Of SYSTEMS:    IMPAIRED fasting glucose:  His fasting glucose is 95 recently, previously 108  A1c was 6.4 done in 8/19 and followed by his PCP Still does not any motivation to exercise despite reminders Has gained a little weight  No results found for: HGBA1C Lab Results  Component Value Date   CREATININE 1.14 11/09/2018     Wt Readings from Last 3 Encounters:  11/17/18 184 lb 3.2 oz (83.6 kg)  11/07/17 187 lb 3.2 oz (84.9 kg)  06/23/17  181 lb 12.8 oz (82.5 kg)   Has hoarseness from chronic reflux   Examination:   BP 130/74 (BP Location: Left Arm, Patient Position: Sitting, Cuff Size: Normal)   Pulse (!) 57   Ht 5\' 4"  (1.626 m)   Wt 184 lb 3.2 oz (83.6 kg)   SpO2 98%   BMI 31.62 kg/m   Spine is normal to palpation    Assessment/Plan:    Osteoporosis. This is associated with asymptomatic vertebral fractures His baseline bone density was -3.0 done in 2014 With taking Reclast in 2017 his last bone density showed his T score to be improved at -2.4 No new fractures  He will need to have his Reclast done again in every other year Bone density prior to next visit  We will continue his vitamin D unchanged   Hypothyroidism taking low dose supplements for some time Only taking a dose of levothyroxine 62.5 g with adequate thyroid levels and subjectively doing well    PREDIABETES:  His last A1c was 6.4 He does need to lose weight and discussed importance of exercise and diet He will start working out at the gym with regular walking for exercise bike as tolerated     Reather Littler 11/17/2018, 8:39 AM

## 2018-11-24 ENCOUNTER — Ambulatory Visit (INDEPENDENT_AMBULATORY_CARE_PROVIDER_SITE_OTHER): Payer: Medicare Other | Admitting: Endocrinology

## 2018-11-24 ENCOUNTER — Other Ambulatory Visit: Payer: Self-pay

## 2018-11-24 DIAGNOSIS — M818 Other osteoporosis without current pathological fracture: Secondary | ICD-10-CM

## 2018-12-17 NOTE — Patient Instructions (Signed)
Schedule a return visit for reclast infusion after 09/02/19.

## 2018-12-17 NOTE — Progress Notes (Signed)
Patient came in saying that Dr. Lucianne Muss said he did not have to have the Reclast this year.  Per Dr. Remus Blake voice order, the patient was told that he did not need this infusion at this time.  To reschedule another reclast infusion after 09/02/2019.

## 2019-01-13 ENCOUNTER — Ambulatory Visit: Payer: Medicare Other | Admitting: Internal Medicine

## 2019-01-13 ENCOUNTER — Encounter: Payer: Self-pay | Admitting: Internal Medicine

## 2019-01-13 VITALS — BP 104/66 | HR 58 | Temp 98.1°F | Ht 64.0 in | Wt 185.0 lb

## 2019-01-13 DIAGNOSIS — J45991 Cough variant asthma: Secondary | ICD-10-CM

## 2019-01-13 DIAGNOSIS — R55 Syncope and collapse: Secondary | ICD-10-CM

## 2019-01-13 DIAGNOSIS — R05 Cough: Secondary | ICD-10-CM | POA: Diagnosis not present

## 2019-01-13 DIAGNOSIS — R058 Other specified cough: Secondary | ICD-10-CM

## 2019-01-13 LAB — NITRIC OXIDE: Nitric Oxide: 33

## 2019-01-13 MED ORDER — PREDNISONE 10 MG PO TABS: ORAL_TABLET | ORAL | 0 refills | Status: DC

## 2019-01-13 NOTE — Progress Notes (Signed)
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 eval by [place] with hoarseness /sorethroat/ drainage/ no better with tonsils out or allergy shots and varied s pattern but then noted onset of episodes of severe coughing x 2012 "with colds" and also sometimes with eating like something spicy or sometimes with  laughter and so severe  passing out with them sev times a year but cough increasing every year since onset in terms of frequency / severity  so referred to pulmonary clinic 12/11/2015 by Dr Azucena Cecil  eval  04/15/12  by Dr Craige Cotta with dx pnds/gerd (upper airway cough) rec maint on gerd rx and f/u in 3 m but did not return    12/11/2015 1st [facility] office visit/ [name]  Re cough x 2012  On timolol eyedrops/ fosfamax Chief Complaint  Patient presents with  . Pulmonary Consult    Referred by Dr. Azucena Cecil. Pt c/o cough "for years" worse x 3 wks. He states that he sometimes coughs until "passes out" or has "seizure like symptoms".  Pt states cough is non prod. He states cough does not bother him often "just when I have a bad cold".   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 Follow-up    pt's cough improved but still present.  cough is nonprod, worse after eating spicy food.  Pt's wife states pt goes into "spells" after coughing sometimes-wife states eyes roll back, starts shaking Xseveral  seconds-pt does not remember these events.    cough is dry  more day than night  / worse with colds/ some better with pepcid, could not take pantoprazole at all  rec Please see patient coordinator before you leave today  to schedule sinus CT> net   Pepcid 20 mg after bfast and after supper  For drainage / throat tickle try take CHLORPHENIRAMINE  4 mg - take one every 4 hours as needed - available over the counter- may cause drowsiness so start with just a bedtime dose or two and see how you tolerate it before trying in daytime   Prednisone 10 mg take  4 each am x 2 days,   2 each am x 2 days,  1 each am x 2 days and stop  Take enough tessalon to where you don't cough at all x 5 straight days then stop  Singulair (montelukast) 10 mg  At bedtime  Please schedule a follow up office visit in 4 weeks, sooner if needed with all medications and candy    02/12/2016  f/u ov/[name] re: uacs with variable assoc hoarseness x 1960's worse since 2012  Chief Complaint  Patient presents with  . Follow-up    Cough is unchanged. No new co's today.   prednisone may  have helped a little but only trasiently/ not clear he followed recs esp re h1 "something I put up my nose you gave me helped my nasal congestion (note we didn't rec any topicals) - still variable daytime dry cough with severe hoarseness  rec Please see patient coordinator before you leave today  to schedule ENT eval for your hoarseness> not done    For drainage / throat tickle try take CHLORPHENIRAMINE  4 mg - take one every 4 hours as needed -   Pepcid should be 20 mg after bfast and after supper since you can't take the reflux medication called pantoprazole  Finish up the montelukast and then try off to see what difference this makes - if not worse don't take it  I will let Dr Lucianne Muss know about the Fosfamax and the potential adverse effect on your throat (especially since you can't take the reflux medicines)     01/13/2019  Acute ov/[name] re:  UACS/  flare x 2 weeks with noct sense of Wheeze "it's coming from my adenoids in my chest"  Chief Complaint  Patient presents with  . Acute Visit    Pt c/o wheezing esp at night x 2 wks. He has minimal cough with yellow sputum.  Dyspnea:  MMRC2 = can't walk a nl pace on a flat grade s sob but does fine slow and flat R foot big toe slows him down Cough: improving on its own p cough syrup/ pattern of cough is severe with pre-syncope  If sitting and syncope if standing X years Sleeping: sleeping on side/back but bed is flat  SABA use: none 02: no   No obvious day to day or daytime variability or assoc excess/ purulent sputum or mucus plugs or hemoptysis or cp or chest tightness, or overt  hb symptoms.    .Also denies any obvious fluctuation of symptoms with weather or environmental changes or other aggravating or alleviating factors except as outlined above   No unusual exposure hx or h/o childhood pna/ asthma or knowledge of premature birth.  Current Allergies, Complete Past Medical History, Past Surgical History, Family History, and Social History were reviewed in [employer] record.  ROS  The following are not active complaints unless bolded Hoarseness, sore throat, dysphagia, dental problems, itching, sneezing,  nasal congestion or discharge of excess mucus or purulent secretions, ear ache,   fever, chills, sweats, unintended wt loss or wt gain, classically pleuritic or exertional cp,  orthopnea pnd or arm/hand swelling  or leg swelling, presyncope, palpitations, abdominal pain, anorexia, nausea, vomiting, diarrhea  or change in bowel habits or change in bladder habits, change in stools or change in urine, dysuria, hematuria,  rash, arthralgias, visual complaints, headache, numbness, weakness or ataxia or problems with walking or coordination,  change in mood or  memory.        Current Meds - NOTE:   Unable to verify as accurately reflecting what pt takes     Medication  Sig  . atorvastatin (LIPITOR) 20 MG tablet   . brimonidine (ALPHAGAN P) 0.1 % SOLN Place 1 drop into both eyes 2 times daily.  . diclofenac sodium (VOLTAREN) 1 % GEL Apply 2 g topically 4 (four) times daily. Rub into affected area of foot 2 to 4 times daily  . dorzolamide (TRUSOPT) 2 % ophthalmic solution Place 1 drop into both eyes 3 (three) times daily.  . ergocalciferol (VITAMIN D2) 50000 UNITS capsule Take 50,000 Units by mouth once a week.  Marland Kitchen  levothyroxine (SYNTHROID, LEVOTHROID) 125 MCG tablet TAKE 1/2 (ONE-HALF) TABLET BY MOUTH ONCE DAILY (Patient taking differently: Take 1/2 tablet by mouth every day except Saturday.)  . LUMIGAN 0.01 % SOLN                            Objective:   Physical Exam  amb obese bm severe hoarse / cough on inspiration    01/13/2019          185   01/12/2016         191   12/11/15 188 lb 12.8 oz (85.639 kg)  09/18/15 194 lb 3.2 oz (88.089 kg)  09/16/14 186 lb 3.2 oz (84.46 kg)    Vital signs reviewed - Note on arrival 02 sats  98% on RA     HEENT: nl dentition, turbinates bilaterally, and oropharynx. Nl external ear canals without cough reflex   NECK :  without JVD/Nodes/TM/ nl carotid upstrokes bilaterally   LUNGS: no acc muscle use,  Nl contour chest which is clear to A and P bilaterally    CV:  RRR  no s3 or murmur or increase in P2, and no edema   ABD:  soft and nontender with nl inspiratory excursion in the supine position. No bruits or organomegaly appreciated, bowel sounds nl  MS:  Nl gait/ ext warm without deformities, calf tenderness, cyanosis or clubbing No obvious joint restrictions   SKIN: warm and dry without lesions    NEURO:  alert, approp, nl sensorium with  no motor or cerebellar deficits apparent.        Assessment & Plan:

## 2019-01-13 NOTE — Patient Instructions (Addendum)
Prednisone 10 mg take  4 each am x 2 days,   2 each am x 2 days,  1 each am x 2 days and stop   Please see patient coordinator before you leave today  to schedule ENT eval for your hoarseness and nasal congestion   Pulmonary follow up is as needed

## 2019-01-14 ENCOUNTER — Encounter: Payer: Self-pay | Admitting: Internal Medicine

## 2019-01-14 NOTE — Assessment & Plan Note (Addendum)
Onset around 2012 PFT 04/15/12>>FEV1 2.29 (91%), FEV1% 81, TLC 4.59(83%), DLCO 65%, no BD. - trial off fosfamax and on gerd rx 12/11/2015 > could not tol PPI  - Allergy profile 12/11/2015 >  Eos 0.2 /  IgE 22 with Neg RAST - Sinus Ct 01/17/2016 > No evidence of sinusitis. 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   These are the same pts (now being labeled as having "irritable larynx syndrome" by some cough centers) who not infrequently have a history of having failed to tolerate ace inhibitors,  dry powder inhalers or biphosphonates or report having atypical/extraesophageal reflux symptoms that don't respond to standard doses of PPI  and are easily confused as having aecopd or asthma flares by even experienced allergists/ pulmonologists (myself included).    rec short course pred/ contine h2 bid and f/u ent next step    I had an extended discussion with the patient and wife reviewing all relevant studies completed to date and  lasting 15 to 20 minutes of a 25 minute acute office visit  With pt not seen by me in over 2.5 years.  Each maintenance medication was reviewed in detail including most importantly the difference between maintenance and prns and under what circumstances the  prns are to be triggered using an action plan format that is not reflected in the computer generated alphabetically organized AVS.     Please see AVS for specific instructions unique to this visit that I personally wrote and verbalized to the the pt in detail and then reviewed with pt  by my nurse highlighting any  changes in therapy recommended at today's visit to their plan of care.

## 2019-01-14 NOTE — Assessment & Plan Note (Signed)
Warned again re driving while coughing/ assuming supine position immediately if possible

## 2019-01-26 ENCOUNTER — Telehealth: Payer: Self-pay | Admitting: Internal Medicine

## 2019-01-26 NOTE — Telephone Encounter (Signed)
Called patient wife Stephen Garcia) back and advised her the note from his 01/13/19 office visit with Dr. Sherene Sires and the referral were reviewed, did not see the name of a specific doctor. However he was referred to [facility] and all of the physicians there are able to take care of her husband and he would be in good hands.  Patient's wife voiced understanding, nothing further needed at this time.

## 2019-02-03 ENCOUNTER — Telehealth: Payer: Self-pay | Admitting: Endocrinology

## 2019-02-03 ENCOUNTER — Other Ambulatory Visit: Payer: Self-pay | Admitting: Endocrinology

## 2019-02-03 NOTE — Telephone Encounter (Signed)
MEDICATION: levothyroxine (SYNTHROID, LEVOTHROID) 125 MCG tablet  PHARMACY:  [facility]  IS THIS A 90 DAY SUPPLY :   IS PATIENT OUT OF MEDICATION:   IF NOT; HOW MUCH IS LEFT: 2 left  LAST APPOINTMENT DATE: @3 /25/2020  NEXT APPOINTMENT DATE:@7 /04/2019  DO WE HAVE YOUR PERMISSION TO LEAVE A DETAILED MESSAGE:  OTHER COMMENTS:    **Let patient know to contact pharmacy at the end of the day to make sure medication is ready. **  ** Please notify patient to allow 48-72 hours to process**  **Encourage patient to contact the pharmacy for refills or they can request refills through [facility]**

## 2019-02-03 NOTE — Telephone Encounter (Signed)
Rx sent 

## 2019-03-05 ENCOUNTER — Other Ambulatory Visit: Payer: Self-pay | Admitting: Endocrinology

## 2019-04-12 ENCOUNTER — Other Ambulatory Visit: Payer: Self-pay | Admitting: Endocrinology

## 2019-05-13 ENCOUNTER — Other Ambulatory Visit: Payer: Self-pay

## 2019-05-17 ENCOUNTER — Other Ambulatory Visit: Payer: Self-pay

## 2019-05-17 ENCOUNTER — Telehealth: Payer: Self-pay | Admitting: Endocrinology

## 2019-05-17 ENCOUNTER — Other Ambulatory Visit (INDEPENDENT_AMBULATORY_CARE_PROVIDER_SITE_OTHER): Payer: Medicare Other

## 2019-05-17 DIAGNOSIS — E063 Autoimmune thyroiditis: Secondary | ICD-10-CM | POA: Diagnosis not present

## 2019-05-17 DIAGNOSIS — M810 Age-related osteoporosis without current pathological fracture: Secondary | ICD-10-CM

## 2019-05-17 DIAGNOSIS — E559 Vitamin D deficiency, unspecified: Secondary | ICD-10-CM | POA: Diagnosis not present

## 2019-05-17 LAB — COMPREHENSIVE METABOLIC PANEL
ALT: 16 U/L (ref 0–53)
AST: 17 U/L (ref 0–37)
Albumin: 4.3 g/dL (ref 3.5–5.2)
Alkaline Phosphatase: 49 U/L (ref 39–117)
BUN: 10 mg/dL (ref 6–23)
CO2: 25 mEq/L (ref 19–32)
Calcium: 9.2 mg/dL (ref 8.4–10.5)
Chloride: 102 mEq/L (ref 96–112)
Creatinine, Ser: 1.08 mg/dL (ref 0.40–1.50)
GFR: 79.7 mL/min (ref >60.00)
Glucose, Bld: 97 mg/dL (ref 70–99)
Potassium: 3.7 mEq/L (ref 3.5–5.1)
Sodium: 136 mEq/L (ref 135–145)
Total Bilirubin: 0.6 mg/dL (ref 0.2–1.2)
Total Protein: 7.5 g/dL (ref 6.0–8.3)

## 2019-05-17 LAB — T4, FREE: Free T4: 0.56 ng/dL — ABNORMAL LOW (ref 0.60–1.60)

## 2019-05-17 LAB — TSH: TSH: 0.93 u[IU]/mL (ref 0.35–4.50)

## 2019-05-17 LAB — VITAMIN D 25 HYDROXY (VIT D DEFICIENCY, FRACTURES): VITD: 42.27 ng/mL (ref 30.00–100.00)

## 2019-05-17 LAB — T3, FREE: T3, Free: 2.7 pg/mL (ref 2.3–4.2)

## 2019-05-17 MED ORDER — LEVOTHYROXINE SODIUM 125 MCG PO TABS
ORAL_TABLET | ORAL | 0 refills | Status: DC
Start: 2019-05-17 — End: 2019-05-20

## 2019-05-17 NOTE — Telephone Encounter (Signed)
Rx has been sent  

## 2019-05-17 NOTE — Telephone Encounter (Signed)
MEDICATION: Levothryroxine  PHARMACY:  [facility] on [address]  IS THIS A 90 DAY SUPPLY : unk  IS PATIENT OUT OF MEDICATION:  yes  IF NOT; HOW MUCH IS LEFT:   LAST APPOINTMENT DATE: @6 /11/2018  NEXT APPOINTMENT DATE:@7 /07/2019  DO WE HAVE YOUR PERMISSION TO LEAVE A DETAILED MESSAGE: yes  OTHER COMMENTS:    **Let patient know to contact pharmacy at the end of the day to make sure medication is ready. **  ** Please notify patient to allow 48-72 hours to process**  **Encourage patient to contact the pharmacy for refills or they can request refills through [facility]**

## 2019-05-20 ENCOUNTER — Other Ambulatory Visit: Payer: Self-pay

## 2019-05-20 ENCOUNTER — Encounter: Payer: Self-pay | Admitting: Endocrinology

## 2019-05-20 ENCOUNTER — Ambulatory Visit (INDEPENDENT_AMBULATORY_CARE_PROVIDER_SITE_OTHER): Payer: Medicare Other | Admitting: Endocrinology

## 2019-05-20 VITALS — BP 134/62 | HR 60 | Ht 64.0 in | Wt 181.6 lb

## 2019-05-20 DIAGNOSIS — M818 Other osteoporosis without current pathological fracture: Secondary | ICD-10-CM | POA: Diagnosis not present

## 2019-05-20 DIAGNOSIS — E039 Hypothyroidism, unspecified: Secondary | ICD-10-CM

## 2019-05-20 DIAGNOSIS — E559 Vitamin D deficiency, unspecified: Secondary | ICD-10-CM | POA: Diagnosis not present

## 2019-05-20 DIAGNOSIS — R7301 Impaired fasting glucose: Secondary | ICD-10-CM

## 2019-05-20 MED ORDER — LEVOTHYROXINE SODIUM 75 MCG PO TABS: 75.0000 ug | ORAL_TABLET | Freq: Every day | ORAL | 1 refills | Status: DC

## 2019-05-20 NOTE — Progress Notes (Signed)
Patient ID: Stephen Garcia, male   DOB: 01/03/1941, 78 y.o.   MRN: [id number]   Reason for Appointment:  followup visit     History of Present Illness:    OSTEOPOROSIS: He has known vertebral compression fracture of L2 on his MRI which was diagnosed on x-rays for nonspecific back pain Initial lumbar compression fractures of L1, L2 were noticed in 2011 on a routine x-ray   Also has had lumbar disc disease and has had chronic low back pain He does not think his back pain is any worse and has not had any episodes of low back pain  He has a bone density done at baseline indicating osteoporosis.  T score was reportedly -3.0 in 2014  Because of his osteoporosis and history of fractures he previously had been on Fosamax weekly since 2014  He was told by the pulmonologist to stop this because of potential for creating reflux.  He was then given an infusion of Reclast on 07/31/2016 and also on 09/01/17, which he tolerated well Follow-up infusion to be done in 08/2019  He has had mild chronic low back pain and no acute episodes of pain, pain is tolerable Also having some pain down the upper right outer thigh   Repeat bone density: Results from 06/26/17 showed T score at lumbar spine -1.7 Left femoral neck -2.4  Vitamin D supplementation: He is taking 50,000 units on every Sunday His level is again normal on the same dose  Lab Results  Component Value Date   VD25OH 42.27 05/17/2019   VD25OH 50.21 11/09/2018   VD25OH 25.93 (L) 11/14/2017   VD25OH 31.66 11/13/2016     HYPOTHYROIDISM: was first diagnosed  several years ago Unclear what his initial symptoms at diagnosis were                Previously  had been taking 75 g levothyroxine daily from his PCP  However because of his age and low normal TSH level he has been taking a half tablet of the 125 g daily since 06/2017  He complains of only occasional fatigue but not generally feeling bad No significant weight change or  cold intolerance  He takes levothyroxine before breakfast daily regularly  Although his TSH is again below 1.0 his free T4 is now low   Lab Results  Component Value Date   TSH 0.93 05/17/2019   TSH 0.75 11/09/2018   TSH 0.76 11/14/2017   FREET4 0.56 (L) 05/17/2019   FREET4 0.62 11/09/2018   FREET4 0.50 (L) 11/14/2017       Allergies as of 05/20/2019   No Known Allergies     Medication List       Accurate as of May 20, 2019  8:57 AM. If you have any questions, ask your nurse or doctor.        STOP taking these medications   diclofenac sodium 1 % Gel Commonly known as: VOLTAREN Stopped by: Reather LittlerAjay Yvan Dority, MD   predniSONE 10 MG tablet Commonly known as: DELTASONE Stopped by: Reather LittlerAjay Francesa Eugenio, MD     TAKE these medications   Alphagan P 0.1 % Soln Generic drug: brimonidine Place 1 drop into both eyes 2 times daily.   atorvastatin 20 MG tablet Commonly known as: LIPITOR   dorzolamide 2 % ophthalmic solution Commonly known as: TRUSOPT Place 1 drop into both eyes 3 (three) times daily.   ergocalciferol 1.25 MG (50000 UT) capsule Commonly known as: VITAMIN D2 Take 50,000 Units by mouth  once a week.   levothyroxine 125 MCG tablet Commonly known as: SYNTHROID Take 1/2 (one-half) tablet by mouth once daily   Lumigan 0.01 % Soln Generic drug: bimatoprost       Allergies: No Known Allergies  Past Medical History:  Diagnosis Date  . Glaucoma   . Hyperlipidemia   . Hypothyroidism     Past Surgical History:  Procedure Laterality Date  . BACK SURGERY    . TONSILLECTOMY      Family History  Problem Relation Age of Onset  . Heart disease Mother   . Asthma Sister     Social History:  reports that he has never smoked. He has never used smokeless tobacco. He reports current alcohol use. He reports that he does not use drugs.  REVIEW Of SYSTEMS:    IMPAIRED fasting glucose:  His fasting glucose is 97 fasting and has been as high as 108  A1c in prediabetic  range done by PCP in 12/2018, result was 6.2 He has lost a little weight this year He says that he is active with taking care of his lawn and is active outside  No results found for: HGBA1C Lab Results  Component Value Date   CREATININE 1.08 05/17/2019     Wt Readings from Last 3 Encounters:  05/20/19 181 lb 9.6 oz (82.4 kg)  01/13/19 185 lb (83.9 kg)  11/17/18 184 lb 3.2 oz (83.6 kg)   Has chronic  hoarseness from reflux    Examination:   BP 134/62 (BP Location: Left Arm, Patient Position: Sitting, Cuff Size: Normal)   Pulse 60   Ht 5\' 4"  (1.626 m)   Wt 181 lb 9.6 oz (82.4 kg)   SpO2 96%   BMI 31.17 kg/m       Assessment/Plan:    Osteoporosis. 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

## 2019-05-20 NOTE — Patient Instructions (Signed)
Take full thyroid every Friday and 1/2 on other days

## 2019-08-16 ENCOUNTER — Other Ambulatory Visit (INDEPENDENT_AMBULATORY_CARE_PROVIDER_SITE_OTHER): Payer: Medicare Other

## 2019-08-16 ENCOUNTER — Other Ambulatory Visit: Payer: Self-pay

## 2019-08-16 DIAGNOSIS — R7301 Impaired fasting glucose: Secondary | ICD-10-CM | POA: Diagnosis not present

## 2019-08-16 DIAGNOSIS — E039 Hypothyroidism, unspecified: Secondary | ICD-10-CM | POA: Diagnosis not present

## 2019-08-16 DIAGNOSIS — M818 Other osteoporosis without current pathological fracture: Secondary | ICD-10-CM

## 2019-08-16 LAB — BASIC METABOLIC PANEL
BUN: 10 mg/dL (ref 6–23)
CO2: 27 mEq/L (ref 19–32)
Calcium: 9.7 mg/dL (ref 8.4–10.5)
Chloride: 101 mEq/L (ref 96–112)
Creatinine, Ser: 1.02 mg/dL (ref 0.40–1.50)
GFR: 85.08 mL/min (ref >60.00)
Glucose, Bld: 96 mg/dL (ref 70–99)
Potassium: 3.9 mEq/L (ref 3.5–5.1)
Sodium: 135 mEq/L (ref 135–145)

## 2019-08-16 LAB — TSH: TSH: 0.53 u[IU]/mL (ref 0.35–4.50)

## 2019-08-16 LAB — T4, FREE: Free T4: 0.62 ng/dL (ref 0.60–1.60)

## 2019-08-18 ENCOUNTER — Ambulatory Visit (INDEPENDENT_AMBULATORY_CARE_PROVIDER_SITE_OTHER): Payer: Medicare Other | Admitting: Endocrinology

## 2019-08-18 ENCOUNTER — Encounter: Payer: Self-pay | Admitting: Endocrinology

## 2019-08-18 ENCOUNTER — Other Ambulatory Visit: Payer: Self-pay

## 2019-08-18 VITALS — BP 112/60 | HR 60 | Ht 64.0 in | Wt 182.4 lb

## 2019-08-18 DIAGNOSIS — E559 Vitamin D deficiency, unspecified: Secondary | ICD-10-CM | POA: Diagnosis not present

## 2019-08-18 DIAGNOSIS — M810 Age-related osteoporosis without current pathological fracture: Secondary | ICD-10-CM

## 2019-08-18 DIAGNOSIS — E039 Hypothyroidism, unspecified: Secondary | ICD-10-CM | POA: Diagnosis not present

## 2019-08-18 NOTE — Progress Notes (Signed)
Patient ID: Stephen Garcia, male   DOB: 1940/12/18, 78 y.o.   MRN: [id number]   Reason for Appointment:  followup visit     History of Present Illness:    OSTEOPOROSIS: He has known vertebral compression fracture of L2 on his MRI which was diagnosed on x-rays for nonspecific back pain Initial lumbar compression fractures of L1, L2 were noticed in 2011 on a routine x-ray   Also has had lumbar disc disease and has had chronic low back pain He does not think his back pain is any worse and has not had any episodes of low back pain  He has a bone density done at baseline indicating osteoporosis.  T score was reportedly -3.0 in 2014  Because of his osteoporosis and history of fractures he previously had been on Fosamax weekly since 2014  He was told by the pulmonologist to stop this because of potential for creating reflux.  He was then given an infusion of Reclast on 07/31/2016 and also on 09/01/17, which he tolerated well  Follow-up infusion is due now  He has had mild chronic low back pain and no acute episodes of pain,  Also having some pain down the upper right outer thigh at times with certain activities as before   Repeat bone density: Results from 06/26/17 showed T score at lumbar spine -1.7 Left femoral neck -2.4  Vitamin D supplementation: He is taking 50,000 units on every Sunday His level is consistently normal on the same dose  Lab Results  Component Value Date   VD25OH 42.27 05/17/2019   VD25OH 50.21 11/09/2018   VD25OH 25.93 (L) 11/14/2017   VD25OH 31.66 11/13/2016     HYPOTHYROIDISM: was first diagnosed  several years ago Unclear what his initial symptoms at diagnosis were                Previously  had been taking 75 g levothyroxine daily from his PCP  However because of his age and low normal TSH level he previously was taking a half tablet of the 125 g daily since 06/2017 However his free T4 was low in July and is now taking 75 mcg daily He does  not think he feels any different with increasing the dose Overall feels fairly good without fatigue This year he has been trying to lose weight  He takes levothyroxine before breakfast daily regularly  Although his TSH is again below 1.0 his free T4 is back to normal similar to previous levels   Lab Results  Component Value Date   TSH 0.53 08/16/2019   TSH 0.93 05/17/2019   TSH 0.75 11/09/2018   FREET4 0.62 08/16/2019   FREET4 0.56 (L) 05/17/2019   FREET4 0.62 11/09/2018       Allergies as of 08/18/2019   No Known Allergies     Medication List       Accurate as of August 18, 2019  8:44 AM. If you have any questions, ask your nurse or doctor.        Stephen Garcia 0.1 % Soln Generic drug: brimonidine Place 1 drop into both eyes 2 times daily.   atorvastatin 20 MG tablet Commonly known as: LIPITOR   dorzolamide 2 % ophthalmic solution Commonly known as: TRUSOPT Place 1 drop into both eyes 3 (three) times daily.   ergocalciferol 1.25 MG (50000 UT) capsule Commonly known as: VITAMIN D2 Take 50,000 Units by mouth once a week.   levothyroxine 75 MCG tablet Commonly known as: SYNTHROID  Take 1 tablet (75 mcg total) by mouth daily.   Lumigan 0.01 % Soln Generic drug: bimatoprost       Allergies: No Known Allergies  Past Medical History:  Diagnosis Date  . Glaucoma   . Hyperlipidemia   . Hypothyroidism     Past Surgical History:  Procedure Laterality Date  . BACK SURGERY    . TONSILLECTOMY      Family History  Problem Relation Age of Onset  . Heart disease Mother   . Asthma Sister     Social History:  reports that he has never smoked. He has never used smokeless tobacco. He reports current alcohol use. He reports that he does not use drugs.  REVIEW Of SYSTEMS:    IMPAIRED fasting glucose:  His fasting glucose is 96 fasting and has been as high as 108  A1c in prediabetic range done by PCP in 12/2018, result was 6.2 He has lost a little weight  this year  He says that he is active with taking care of his lawn and is active outside  No results found for: HGBA1C Lab Results  Component Value Date   CREATININE 1.02 08/16/2019     Wt Readings from Last 3 Encounters:  08/18/19 182 lb 6.4 oz (82.7 kg)  05/20/19 181 lb 9.6 oz (82.4 kg)  01/13/19 185 lb (83.9 kg)   Has chronic  hoarseness from reflux    Examination:   BP 112/60 (BP Location: Left Arm, Patient Position: Sitting, Cuff Size: Normal)   Pulse 60   Ht 5\' 4"  (1.626 m)   Wt 182 lb 6.4 oz (82.7 kg)   SpO2 97%   BMI 31.31 kg/m       Assessment/Plan:    Osteoporosis. 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

## 2019-08-25 ENCOUNTER — Other Ambulatory Visit: Payer: Self-pay

## 2019-08-25 ENCOUNTER — Ambulatory Visit (INDEPENDENT_AMBULATORY_CARE_PROVIDER_SITE_OTHER): Payer: Medicare Other | Admitting: Nutrition

## 2019-08-25 DIAGNOSIS — M810 Age-related osteoporosis without current pathological fracture: Secondary | ICD-10-CM | POA: Diagnosis not present

## 2019-08-25 NOTE — Patient Instructions (Signed)
Drink 4-6 glasses of water today. Continue to take your Calcium and Vitamin D per Dr. Ronnie Derby order

## 2019-08-25 NOTE — Progress Notes (Signed)
Per Dr. Ronnie Derby note on 08/18/19, and after the paitent  signed the consent form, and IV was started in the patient's right arm using a 22g needle.  Normal saline was started at 9:30 AM for 3 minutes.  After it was determined that the IV was not infiltrated, 5 mg. Of  Reclast was infused  Starting at 9:34 and ending at [place].  The patient reported no symptoms of pain or irritation at the infusion site.  Normal saline was infused for an additional 2 minutes to flush the tubing, and then the IV was discontinued.  The site showed no signes of redness or swelling. He was encouraged to continue his calcium and Vitamin D per Dr. Ronnie Derby orders. Leonia Reader, RN

## 2019-09-22 ENCOUNTER — Other Ambulatory Visit: Payer: Self-pay

## 2019-09-22 DIAGNOSIS — Z20822 Contact with and (suspected) exposure to covid-19: Secondary | ICD-10-CM

## 2019-09-24 LAB — NOVEL CORONAVIRUS, NAA: SARS-CoV-2, NAA: NOT DETECTED

## 2019-10-26 ENCOUNTER — Other Ambulatory Visit: Payer: Self-pay | Admitting: Endocrinology

## 2019-12-01 ENCOUNTER — Ambulatory Visit: Payer: Medicare Other | Attending: Internal Medicine

## 2019-12-01 DIAGNOSIS — Z23 Encounter for immunization: Secondary | ICD-10-CM | POA: Insufficient documentation

## 2019-12-01 NOTE — Progress Notes (Signed)
   Covid-19 Vaccination Clinic  Name:  HAZE ANTILLON    MRN: [id number] DOB: 01/21/1941  12/01/2019  Mr. Noreen was observed post Covid-19 immunization for 15 minutes without incidence. He was provided with Vaccine Information Sheet and instruction to access the V-Safe system.   Mr. Kotlyar was instructed to call 911 with any severe reactions post vaccine: Marland Kitchen Difficulty breathing  . Swelling of your face and throat  . A fast heartbeat  . A bad rash all over your body  . Dizziness and weakness    Immunizations Administered    Name Date Dose VIS Date Route   Pfizer COVID-19 Vaccine 12/01/2019  1:54 PM 0.3 mL 10/22/2019 Intramuscular   Manufacturer: [employer], [employer]   Lot: [id number]   NDC: [address]-8

## 2019-12-22 ENCOUNTER — Ambulatory Visit: Payer: Medicare Other | Attending: Internal Medicine

## 2019-12-22 DIAGNOSIS — Z23 Encounter for immunization: Secondary | ICD-10-CM

## 2019-12-22 NOTE — Progress Notes (Signed)
   Covid-19 Vaccination Clinic  Name:  Stephen Garcia    MRN: [id number] DOB: [birth year]  12/22/2019  [name] was observed post Covid-19 immunization for 15 minutes without incidence. He was provided with Vaccine Information Sheet and instruction to access the V-Safe system.   [name] was instructed to call 911 with any severe reactions post vaccine: Marland Kitchen Difficulty breathing  . Swelling of your face and throat  . A fast heartbeat  . A bad rash all over your body  . Dizziness and weakness    Immunizations Administered    Name Date Dose VIS Date Route   Pfizer COVID-19 Vaccine 12/22/2019  3:58 PM 0.3 mL 10/22/2019 Intramuscular   Manufacturer: [employer], [employer]   Lot: [id number]   NDC: [address]-2

## 2020-02-28 ENCOUNTER — Other Ambulatory Visit: Payer: Self-pay | Admitting: Endocrinology

## 2020-07-12 ENCOUNTER — Other Ambulatory Visit: Payer: Self-pay | Admitting: Endocrinology

## 2020-08-14 ENCOUNTER — Other Ambulatory Visit: Payer: Self-pay

## 2020-08-14 ENCOUNTER — Other Ambulatory Visit (INDEPENDENT_AMBULATORY_CARE_PROVIDER_SITE_OTHER): Payer: Medicare Other

## 2020-08-14 DIAGNOSIS — E039 Hypothyroidism, unspecified: Secondary | ICD-10-CM | POA: Diagnosis not present

## 2020-08-14 DIAGNOSIS — E559 Vitamin D deficiency, unspecified: Secondary | ICD-10-CM | POA: Diagnosis not present

## 2020-08-14 LAB — BASIC METABOLIC PANEL WITH GFR
BUN: 11 mg/dL (ref 6–23)
CO2: 27 meq/L (ref 19–32)
Calcium: 9.4 mg/dL (ref 8.4–10.5)
Chloride: 101 meq/L (ref 96–112)
Creatinine, Ser: 1.17 mg/dL (ref 0.40–1.50)
GFR: 72.44 mL/min
Glucose, Bld: 111 mg/dL — ABNORMAL HIGH (ref 70–99)
Potassium: 3.8 meq/L (ref 3.5–5.1)
Sodium: 135 meq/L (ref 135–145)

## 2020-08-14 LAB — VITAMIN D 25 HYDROXY (VIT D DEFICIENCY, FRACTURES): VITD: 54.78 ng/mL (ref 30.00–100.00)

## 2020-08-14 LAB — T4, FREE: Free T4: 0.55 ng/dL — ABNORMAL LOW (ref 0.60–1.60)

## 2020-08-14 LAB — TSH: TSH: 0.62 u[IU]/mL (ref 0.35–4.50)

## 2020-08-17 ENCOUNTER — Ambulatory Visit (INDEPENDENT_AMBULATORY_CARE_PROVIDER_SITE_OTHER): Payer: Medicare Other | Admitting: Endocrinology

## 2020-08-17 ENCOUNTER — Other Ambulatory Visit: Payer: Self-pay

## 2020-08-17 ENCOUNTER — Encounter: Payer: Self-pay | Admitting: Endocrinology

## 2020-08-17 VITALS — BP 138/84 | HR 58 | Ht 65.0 in | Wt 185.0 lb

## 2020-08-17 DIAGNOSIS — E039 Hypothyroidism, unspecified: Secondary | ICD-10-CM | POA: Diagnosis not present

## 2020-08-17 DIAGNOSIS — M810 Age-related osteoporosis without current pathological fracture: Secondary | ICD-10-CM

## 2020-08-17 MED ORDER — EUTHYROX 100 MCG PO TABS: 100.0000 ug | ORAL_TABLET | Freq: Every day | ORAL | 1 refills | Status: DC

## 2020-08-17 NOTE — Progress Notes (Signed)
Patient ID: Stephen Garcia, male   DOB: [birth year], 79 y.o.   MRN: [id number]   Reason for Appointment:  followup visit     History of Present Illness:    OSTEOPOROSIS: He has known vertebral compression fracture of L2 on his MRI which was diagnosed on x-rays for nonspecific back pain Initial lumbar compression fractures of L1, L2 were noticed in 2011 on a routine x-ray   Also has had lumbar disc disease and has had chronic low back pain He does not think his back pain is any worse and has not had any episodes of low back pain  He has a bone density done at baseline indicating osteoporosis.  T score was reportedly -3.0 in 2014  Because of his osteoporosis and history of fractures he previously had been on Fosamax weekly since 2014  He was told by the pulmonologist to stop this because of potential for creating reflux.  He was then given an infusion of Reclast on 07/31/2016, 09/01/17 and in 08/2019, No side effects with these infusions which he tolerated well  Follow-up infusion is due in 10/22  He has had mild chronic low back pain which is about the same as usual   Repeat bone density: Results from 06/26/17 showed T score at lumbar spine -1.7 Left femoral neck -2.4  Vitamin D supplementation: He is taking 50,000 units regularly every Sunday His level is consistently normal on the same dose  Lab Results  Component Value Date   VD25OH 54.78 08/14/2020   VD25OH 42.27 05/17/2019   VD25OH 50.21 11/09/2018   VD25OH 25.93 (L) 11/14/2017     HYPOTHYROIDISM: was first diagnosed  several years ago Unclear what his initial symptoms at diagnosis were  When his free T4 was low in July 2020 his dose was increased slightly to 75 mcg daily He says that he does not have much motivation and feels a little lethargic No cold intolerance  He takes levothyroxine before breakfast daily regularly  Although his TSH is again below 1.0 his free T4 is below normal at 0.55 compared to  0.62  Lab Results  Component Value Date   TSH 0.62 08/14/2020   TSH 0.53 08/16/2019   TSH 0.93 05/17/2019   FREET4 0.55 (L) 08/14/2020   FREET4 0.62 08/16/2019   FREET4 0.56 (L) 05/17/2019       Allergies as of 08/17/2020   No Known Allergies     Medication List       Accurate as of August 17, 2020  9:16 AM. If you have any questions, ask your nurse or doctor.        Alphagan P 0.1 % Soln Generic drug: brimonidine Place 1 drop into both eyes 2 times daily.   atorvastatin 20 MG tablet Commonly known as: LIPITOR   dorzolamide 2 % ophthalmic solution Commonly known as: TRUSOPT Place 1 drop into both eyes 3 (three) times daily.   ergocalciferol 1.25 MG (50000 UT) capsule Commonly known as: VITAMIN D2 Take 50,000 Units by mouth once a week.   Euthyrox 75 MCG tablet Generic drug: levothyroxine Take 1 tablet by mouth once daily   Lumigan 0.01 % Soln Generic drug: bimatoprost       Allergies: No Known Allergies  Past Medical History:  Diagnosis Date  . Glaucoma   . Hyperlipidemia   . Hypothyroidism     Past Surgical History:  Procedure Laterality Date  . BACK SURGERY    . TONSILLECTOMY  Family History  Problem Relation Age of Onset  . Heart disease Mother   . Asthma Sister     Social History:  reports that he has never smoked. He has never used smokeless tobacco. He reports current alcohol use. He reports that he does not use drugs.  REVIEW Of SYSTEMS:    IMPAIRED fasting glucose:  His fasting glucose is 96 fasting and has been as high as 108  A1c in prediabetic range done by PCP and most recent level is 6.4 done in 07/2020  Previous result was 6.2   No results found for: HGBA1C Lab Results  Component Value Date   CREATININE 1.17 08/14/2020   He has not been able to lose weight, however not able to do much exercise now  Wt Readings from Last 3 Encounters:  08/17/20 185 lb (83.9 kg)  08/31/19 185 lb (83.9 kg)  08/18/19 182 lb  6.4 oz (82.7 kg)   Has chronic  hoarseness from reflux    Examination:   BP 138/84   Pulse (!) 58   Ht 5\' 5"  (1.651 m)   Wt 185 lb (83.9 kg)   SpO2 98%   BMI 30.79 kg/m   No unusual prominence of the spinous processes of the thoracic and lumbar spine and no tenderness    Assessment/Plan:    Osteoporosis. 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

## 2020-08-17 NOTE — Patient Instructions (Addendum)
Change thyroid Rx  With 75 dose take 1 pill daily but 2 pills on Sundays  Vitamin D every other week

## 2020-10-09 ENCOUNTER — Ambulatory Visit: Payer: Medicare Other | Attending: Internal Medicine

## 2020-10-09 DIAGNOSIS — Z23 Encounter for immunization: Secondary | ICD-10-CM

## 2020-10-09 NOTE — Progress Notes (Signed)
   Covid-19 Vaccination Clinic  Name:  Stephen Garcia    MRN: [id number] DOB: 1940/12/08  10/09/2020  [name] was observed post Covid-19 immunization for 15 minutes without incident. He was provided with Vaccine Information Sheet and instruction to access the V-Safe system.   [name] was instructed to call 911 with any severe reactions post vaccine: Marland Kitchen Difficulty breathing  . Swelling of face and throat  . A fast heartbeat  . A bad rash all over body  . Dizziness and weakness   Immunizations Administered    Name Date Dose VIS Date Route   Pfizer COVID-19 Vaccine 10/09/2020  1:22 PM 0.3 mL 08/30/2020 Intramuscular   Manufacturer: [employer], [employer]   Lot: [id number]   NDC: [address]-4

## 2020-11-14 ENCOUNTER — Other Ambulatory Visit: Payer: Self-pay

## 2020-11-14 ENCOUNTER — Other Ambulatory Visit (INDEPENDENT_AMBULATORY_CARE_PROVIDER_SITE_OTHER): Payer: Medicare Other

## 2020-11-14 DIAGNOSIS — E039 Hypothyroidism, unspecified: Secondary | ICD-10-CM

## 2020-11-14 LAB — TSH: TSH: 0.11 u[IU]/mL — ABNORMAL LOW (ref 0.35–4.50)

## 2020-11-14 LAB — T4, FREE: Free T4: 0.93 ng/dL (ref 0.60–1.60)

## 2020-11-17 ENCOUNTER — Encounter: Payer: Self-pay | Admitting: Endocrinology

## 2020-11-17 ENCOUNTER — Other Ambulatory Visit: Payer: Self-pay

## 2020-11-17 ENCOUNTER — Ambulatory Visit: Payer: Medicare Other | Admitting: Endocrinology

## 2020-11-17 VITALS — BP 136/76 | HR 62 | Ht 65.0 in | Wt 181.8 lb

## 2020-11-17 DIAGNOSIS — M810 Age-related osteoporosis without current pathological fracture: Secondary | ICD-10-CM | POA: Diagnosis not present

## 2020-11-17 DIAGNOSIS — E039 Hypothyroidism, unspecified: Secondary | ICD-10-CM

## 2020-11-17 NOTE — Progress Notes (Signed)
Patient ID: Stephen Garcia, male   DOB: [birth year], 80 y.o.   MRN: [id number]   Reason for Appointment:  followup visit     History of Present Illness:    OSTEOPOROSIS: He has known vertebral compression fracture of L2 on his MRI which was diagnosed on x-rays for nonspecific back pain Initial lumbar compression fractures of L1, L2 were noticed in 2011 on a routine x-ray   Also has had lumbar disc disease and has had chronic low back pain He does not think his back pain is any worse and has not had any episodes of low back pain  He has a bone density done at baseline indicating osteoporosis.  T score was reportedly -3.0 in 2014  Because of his osteoporosis and history of fractures he previously had been on Fosamax weekly since 2014  He was told by the pulmonologist to stop this because of potential for creating reflux.  He was then given an infusion of Reclast on 07/31/2016, 09/01/17 in 08/2019, which he tolerated well  He has had mild chronic low back pain    Last bone density: Results from 06/26/17 showed T score at lumbar spine -1.7 Left femoral neck -2.4  Vitamin D supplementation: He is taking 50,000 units on every Sunday His vitamin D level is consistently normal on the same dose  Lab Results  Component Value Date   VD25OH 54.78 08/14/2020   VD25OH 42.27 05/17/2019   VD25OH 50.21 11/09/2018   VD25OH 25.93 (L) 11/14/2017     HYPOTHYROIDISM: was first diagnosed  several years ago Unclear what his initial symptoms at diagnosis were               Previously  had been taking 75 g levothyroxine daily from his PCP  However his free T4 was low in 10/21 and is now taking 100 mcg daily Has been prescribed Euthyrox from [facility] He does feels less tired since he increased the dose No recent fatigue and overall feels good Also no complaints of shakiness, palpitations or jitteriness  He takes levothyroxine before breakfast daily   Although his TSH is low  his free T4  is back to normal at 0.93 compared to 0.5  Lab Results  Component Value Date   TSH 0.11 (L) 11/14/2020   TSH 0.62 08/14/2020   TSH 0.53 08/16/2019   FREET4 0.93 11/14/2020   FREET4 0.55 (L) 08/14/2020   FREET4 0.62 08/16/2019       Allergies as of 11/17/2020   No Known Allergies     Medication List       Accurate as of November 17, 2020 10:05 AM. If you have any questions, ask your nurse or doctor.        atorvastatin 20 MG tablet Commonly known as: LIPITOR   brimonidine 0.1 % Soln Commonly known as: ALPHAGAN P Place 1 drop into both eyes 2 times daily.   dorzolamide 2 % ophthalmic solution Commonly known as: TRUSOPT Place 1 drop into both eyes 3 (three) times daily.   ergocalciferol 1.25 MG (50000 UT) capsule Commonly known as: VITAMIN D2 Take 50,000 Units by mouth once a week.   Euthyrox 100 MCG tablet Generic drug: levothyroxine Take 1 tablet (100 mcg total) by mouth daily before breakfast.   Lumigan 0.01 % Soln Generic drug: bimatoprost   prednisoLONE acetate 1 % ophthalmic suspension Commonly known as: PRED FORTE 1 drop 4 (four) times daily.       Allergies: No Known Allergies  Past Medical History:  Diagnosis Date  . Glaucoma   . Hyperlipidemia   . Hypothyroidism     Past Surgical History:  Procedure Laterality Date  . BACK SURGERY    . TONSILLECTOMY      Family History  Problem Relation Age of Onset  . Heart disease Mother   . Asthma Sister     Social History:  reports that he has never smoked. He has never used smokeless tobacco. He reports current alcohol use. He reports that he does not use drugs.  REVIEW Of SYSTEMS:    IMPAIRED fasting glucose:  His fasting glucose is 96 fasting and has been as high as 108  A1c in prediabetic range done by PCP in 12/2018, result was 6.2  He appears to have lost a little weight, he thinks he is doing better on his diet  No results found for: HGBA1C Lab Results  Component Value Date    CREATININE 1.17 08/14/2020     Wt Readings from Last 3 Encounters:  11/17/20 181 lb 12.8 oz (82.5 kg)  08/17/20 185 lb (83.9 kg)  08/31/19 185 lb (83.9 kg)   Has chronic  hoarseness from reflux    Examination:   BP 136/76   Pulse 62   Ht 5\' 5"  (1.651 m)   Wt 181 lb 12.8 oz (82.5 kg)   SpO2 96%   BMI 30.25 kg/m   No tremor present. Biceps reflexes appear normal    Assessment/Plan:    Osteoporosis. 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

## 2020-12-06 DIAGNOSIS — H402213 Chronic angle-closure glaucoma, right eye, severe stage: Secondary | ICD-10-CM | POA: Diagnosis not present

## 2020-12-06 DIAGNOSIS — H402223 Chronic angle-closure glaucoma, left eye, severe stage: Secondary | ICD-10-CM | POA: Diagnosis not present

## 2021-01-03 DIAGNOSIS — H402213 Chronic angle-closure glaucoma, right eye, severe stage: Secondary | ICD-10-CM | POA: Diagnosis not present

## 2021-01-03 DIAGNOSIS — H402223 Chronic angle-closure glaucoma, left eye, severe stage: Secondary | ICD-10-CM | POA: Diagnosis not present

## 2021-02-16 DIAGNOSIS — Z1211 Encounter for screening for malignant neoplasm of colon: Secondary | ICD-10-CM | POA: Diagnosis not present

## 2021-02-16 DIAGNOSIS — E782 Mixed hyperlipidemia: Secondary | ICD-10-CM | POA: Diagnosis not present

## 2021-02-16 DIAGNOSIS — E559 Vitamin D deficiency, unspecified: Secondary | ICD-10-CM | POA: Diagnosis not present

## 2021-02-16 DIAGNOSIS — E039 Hypothyroidism, unspecified: Secondary | ICD-10-CM | POA: Diagnosis not present

## 2021-02-16 DIAGNOSIS — Z0000 Encounter for general adult medical examination without abnormal findings: Secondary | ICD-10-CM | POA: Diagnosis not present

## 2021-02-16 DIAGNOSIS — R7303 Prediabetes: Secondary | ICD-10-CM | POA: Diagnosis not present

## 2021-02-16 DIAGNOSIS — M810 Age-related osteoporosis without current pathological fracture: Secondary | ICD-10-CM | POA: Diagnosis not present

## 2021-02-16 DIAGNOSIS — R053 Chronic cough: Secondary | ICD-10-CM | POA: Diagnosis not present

## 2021-02-16 DIAGNOSIS — R7309 Other abnormal glucose: Secondary | ICD-10-CM | POA: Diagnosis not present

## 2021-02-16 DIAGNOSIS — Z1389 Encounter for screening for other disorder: Secondary | ICD-10-CM | POA: Diagnosis not present

## 2021-02-16 DIAGNOSIS — M1990 Unspecified osteoarthritis, unspecified site: Secondary | ICD-10-CM | POA: Diagnosis not present

## 2021-02-16 DIAGNOSIS — M549 Dorsalgia, unspecified: Secondary | ICD-10-CM | POA: Diagnosis not present

## 2021-02-21 DIAGNOSIS — M79641 Pain in right hand: Secondary | ICD-10-CM | POA: Diagnosis not present

## 2021-02-21 DIAGNOSIS — M19042 Primary osteoarthritis, left hand: Secondary | ICD-10-CM | POA: Diagnosis not present

## 2021-02-21 DIAGNOSIS — M19041 Primary osteoarthritis, right hand: Secondary | ICD-10-CM | POA: Diagnosis not present

## 2021-02-21 DIAGNOSIS — M79642 Pain in left hand: Secondary | ICD-10-CM | POA: Diagnosis not present

## 2021-03-12 DIAGNOSIS — H402233 Chronic angle-closure glaucoma, bilateral, severe stage: Secondary | ICD-10-CM | POA: Diagnosis not present

## 2021-03-30 DIAGNOSIS — H524 Presbyopia: Secondary | ICD-10-CM | POA: Diagnosis not present

## 2021-03-30 DIAGNOSIS — H2512 Age-related nuclear cataract, left eye: Secondary | ICD-10-CM | POA: Diagnosis not present

## 2021-03-30 DIAGNOSIS — H25012 Cortical age-related cataract, left eye: Secondary | ICD-10-CM | POA: Diagnosis not present

## 2021-03-30 DIAGNOSIS — H402234 Chronic angle-closure glaucoma, bilateral, indeterminate stage: Secondary | ICD-10-CM | POA: Diagnosis not present

## 2021-04-16 DIAGNOSIS — H402213 Chronic angle-closure glaucoma, right eye, severe stage: Secondary | ICD-10-CM | POA: Diagnosis not present

## 2021-04-16 DIAGNOSIS — H402223 Chronic angle-closure glaucoma, left eye, severe stage: Secondary | ICD-10-CM | POA: Diagnosis not present

## 2021-04-21 ENCOUNTER — Other Ambulatory Visit: Payer: Self-pay | Admitting: Endocrinology

## 2021-05-15 ENCOUNTER — Other Ambulatory Visit (INDEPENDENT_AMBULATORY_CARE_PROVIDER_SITE_OTHER): Payer: Medicare Other

## 2021-05-15 ENCOUNTER — Other Ambulatory Visit: Payer: Self-pay

## 2021-05-15 DIAGNOSIS — E039 Hypothyroidism, unspecified: Secondary | ICD-10-CM | POA: Diagnosis not present

## 2021-05-15 DIAGNOSIS — M810 Age-related osteoporosis without current pathological fracture: Secondary | ICD-10-CM | POA: Diagnosis not present

## 2021-05-15 LAB — T4, FREE: Free T4: 0.9 ng/dL (ref 0.60–1.60)

## 2021-05-15 LAB — BASIC METABOLIC PANEL
BUN: 13 mg/dL (ref 6–23)
CO2: 26 mEq/L (ref 19–32)
Calcium: 9.3 mg/dL (ref 8.4–10.5)
Chloride: 104 mEq/L (ref 96–112)
Creatinine, Ser: 1.03 mg/dL (ref 0.40–1.50)
GFR: 68.13 mL/min (ref >60.00)
Glucose, Bld: 99 mg/dL (ref 70–99)
Potassium: 3.8 mEq/L (ref 3.5–5.1)
Sodium: 137 mEq/L (ref 135–145)

## 2021-05-15 LAB — TSH: TSH: 0.04 u[IU]/mL — ABNORMAL LOW (ref 0.35–5.50)

## 2021-05-15 LAB — VITAMIN D 25 HYDROXY (VIT D DEFICIENCY, FRACTURES): VITD: 46.92 ng/mL (ref 30.00–100.00)

## 2021-05-18 ENCOUNTER — Encounter: Payer: Self-pay | Admitting: Endocrinology

## 2021-05-18 ENCOUNTER — Ambulatory Visit (INDEPENDENT_AMBULATORY_CARE_PROVIDER_SITE_OTHER): Payer: Medicare Other | Admitting: Endocrinology

## 2021-05-18 ENCOUNTER — Other Ambulatory Visit: Payer: Self-pay

## 2021-05-18 VITALS — BP 120/64 | HR 76 | Ht 65.0 in | Wt 181.0 lb

## 2021-05-18 DIAGNOSIS — E559 Vitamin D deficiency, unspecified: Secondary | ICD-10-CM | POA: Diagnosis not present

## 2021-05-18 DIAGNOSIS — R7301 Impaired fasting glucose: Secondary | ICD-10-CM | POA: Diagnosis not present

## 2021-05-18 DIAGNOSIS — M810 Age-related osteoporosis without current pathological fracture: Secondary | ICD-10-CM

## 2021-05-18 DIAGNOSIS — E039 Hypothyroidism, unspecified: Secondary | ICD-10-CM | POA: Diagnosis not present

## 2021-05-18 NOTE — Patient Instructions (Signed)
Skip thyroid pill on Sundays

## 2021-05-18 NOTE — Progress Notes (Signed)
Patient ID: Stephen Garcia, male   DOB: 08-Jun-1941, 80 y.o.   MRN: [id number]   Reason for Appointment:  followup visit     History of Present Illness:    OSTEOPOROSIS: He has known vertebral compression fracture of L2 on his MRI which was diagnosed on x-rays for nonspecific back pain Initial lumbar compression fractures of L1, L2 were noticed in 2011 on a routine x-ray   Also has had lumbar disc disease and has had chronic low back pain He does not think his back pain is any worse and has not had any episodes of low back pain  He has a bone density done at baseline indicating osteoporosis.  T score was reportedly -3.0 in 2014  Because of his osteoporosis and history of fractures he previously had been on Fosamax weekly since 2014  He was told by the pulmonologist to stop this because of potential for creating reflux.  He was then given an infusion of Reclast on 07/31/2016, 09/01/17 in 08/2019, which he tolerated well  He has had chronic low back pain which is not any worse   Last bone density: Results from 06/26/17 showed T score at lumbar spine -1.7 Left femoral neck -2.4  Vitamin D supplementation: He is taking 50,000 units as prescribed every Sunday His vitamin D level is consistently normal as below  Lab Results  Component Value Date   VD25OH 46.92 05/15/2021   VD25OH 54.78 08/14/2020   VD25OH 42.27 05/17/2019   VD25OH 50.21 11/09/2018     HYPOTHYROIDISM: was first diagnosed  several years ago Unclear what his initial symptoms at diagnosis were Previously  had been taking 75 g levothyroxine daily from his PCP  However his free T4 was low in 10/21 and is now taking 100 mcg daily; felt better when the dose was increased Has been taking Euthyrox from [facility]  No recent fatigue or sluggishness  Also no complaints of shakiness, palpitations or jitteriness  He takes levothyroxine before breakfast daily   Although his free T4 is about the same his TSH is now  only 0.04 and lower than before   Lab Results  Component Value Date   TSH 0.04 (L) 05/15/2021   TSH 0.11 (L) 11/14/2020   TSH 0.62 08/14/2020   FREET4 0.90 05/15/2021   FREET4 0.93 11/14/2020   FREET4 0.55 (L) 08/14/2020       Allergies as of 05/18/2021   No Known Allergies      Medication List        Accurate as of May 18, 2021 10:12 AM. If you have any questions, ask your nurse or doctor.          atorvastatin 20 MG tablet Commonly known as: LIPITOR   brimonidine 0.1 % Soln Commonly known as: ALPHAGAN P Place 1 drop into both eyes 2 times daily.   dorzolamide 2 % ophthalmic solution Commonly known as: TRUSOPT Place 1 drop into both eyes 3 (three) times daily.   ergocalciferol 1.25 MG (50000 UT) capsule Commonly known as: VITAMIN D2 Take 50,000 Units by mouth once a week.   Euthyrox 100 MCG tablet Generic drug: levothyroxine TAKE 1 TABLET BY MOUTH ONCE DAILY BEFORE BREAKFAST   Lumigan 0.01 % Soln Generic drug: bimatoprost   prednisoLONE acetate 1 % ophthalmic suspension Commonly known as: PRED FORTE 1 drop 4 (four) times daily.        Allergies: No Known Allergies  Past Medical History:  Diagnosis Date   Glaucoma  Hyperlipidemia    Hypothyroidism     Past Surgical History:  Procedure Laterality Date   BACK SURGERY     TONSILLECTOMY      Family History  Problem Relation Age of Onset   Heart disease Mother    Asthma Sister     Social History:  reports that he has never smoked. He has never used smokeless tobacco. He reports current alcohol use. He reports that he does not use drugs.  REVIEW Of SYSTEMS:    IMPAIRED fasting glucose:  His fasting glucose is 99 fasting and has been as high as 108  A1c in prediabetic range done by PCP in 4/22, result was 6.1   Has not been able to lose any weight recently  No results found for: HGBA1C Lab Results  Component Value Date   CREATININE 1.03 05/15/2021     Wt Readings from Last  3 Encounters:  05/18/21 181 lb (82.1 kg)  11/17/20 181 lb 12.8 oz (82.5 kg)  08/17/20 185 lb (83.9 kg)   Has chronic  hoarseness from reflux    Examination:   BP 120/64 (BP Location: Left Arm, Patient Position: Sitting, Cuff Size: Normal)   Pulse 76   Ht 5\' 5"  (1.651 m)   Wt 181 lb (82.1 kg)   SpO2 99%   BMI 30.12 kg/m      Assessment/Plan:    Osteoporosis. 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

## 2021-05-21 DIAGNOSIS — H402223 Chronic angle-closure glaucoma, left eye, severe stage: Secondary | ICD-10-CM | POA: Diagnosis not present

## 2021-05-21 DIAGNOSIS — H402213 Chronic angle-closure glaucoma, right eye, severe stage: Secondary | ICD-10-CM | POA: Diagnosis not present

## 2021-06-25 DIAGNOSIS — H402223 Chronic angle-closure glaucoma, left eye, severe stage: Secondary | ICD-10-CM | POA: Diagnosis not present

## 2021-06-25 DIAGNOSIS — H402213 Chronic angle-closure glaucoma, right eye, severe stage: Secondary | ICD-10-CM | POA: Diagnosis not present

## 2021-07-21 ENCOUNTER — Other Ambulatory Visit: Payer: Self-pay | Admitting: Endocrinology

## 2021-08-17 ENCOUNTER — Other Ambulatory Visit (INDEPENDENT_AMBULATORY_CARE_PROVIDER_SITE_OTHER): Payer: Medicare Other

## 2021-08-17 ENCOUNTER — Other Ambulatory Visit: Payer: Self-pay

## 2021-08-17 DIAGNOSIS — M810 Age-related osteoporosis without current pathological fracture: Secondary | ICD-10-CM | POA: Diagnosis not present

## 2021-08-17 DIAGNOSIS — E039 Hypothyroidism, unspecified: Secondary | ICD-10-CM | POA: Diagnosis not present

## 2021-08-17 DIAGNOSIS — R7303 Prediabetes: Secondary | ICD-10-CM | POA: Diagnosis not present

## 2021-08-17 DIAGNOSIS — M1990 Unspecified osteoarthritis, unspecified site: Secondary | ICD-10-CM | POA: Diagnosis not present

## 2021-08-17 DIAGNOSIS — E782 Mixed hyperlipidemia: Secondary | ICD-10-CM | POA: Diagnosis not present

## 2021-08-17 DIAGNOSIS — M549 Dorsalgia, unspecified: Secondary | ICD-10-CM | POA: Diagnosis not present

## 2021-08-17 DIAGNOSIS — R053 Chronic cough: Secondary | ICD-10-CM | POA: Diagnosis not present

## 2021-08-17 DIAGNOSIS — E559 Vitamin D deficiency, unspecified: Secondary | ICD-10-CM | POA: Diagnosis not present

## 2021-08-17 DIAGNOSIS — Z23 Encounter for immunization: Secondary | ICD-10-CM | POA: Diagnosis not present

## 2021-08-17 LAB — BASIC METABOLIC PANEL
BUN: 12 mg/dL (ref 6–23)
CO2: 27 mEq/L (ref 19–32)
Calcium: 9.4 mg/dL (ref 8.4–10.5)
Chloride: 102 mEq/L (ref 96–112)
Creatinine, Ser: 1.02 mg/dL (ref 0.40–1.50)
GFR: 68.8 mL/min (ref >60.00)
Glucose, Bld: 89 mg/dL (ref 70–99)
Potassium: 4.3 mEq/L (ref 3.5–5.1)
Sodium: 136 mEq/L (ref 135–145)

## 2021-08-17 LAB — TSH: TSH: 0.06 u[IU]/mL — ABNORMAL LOW (ref 0.35–5.50)

## 2021-08-17 LAB — T4, FREE: Free T4: 0.93 ng/dL (ref 0.60–1.60)

## 2021-08-17 LAB — T3, FREE: T3, Free: 3.4 pg/mL (ref 2.3–4.2)

## 2021-08-21 ENCOUNTER — Ambulatory Visit (INDEPENDENT_AMBULATORY_CARE_PROVIDER_SITE_OTHER): Payer: Medicare Other | Admitting: Endocrinology

## 2021-08-21 ENCOUNTER — Other Ambulatory Visit: Payer: Self-pay

## 2021-08-21 VITALS — BP 130/72 | HR 50 | Ht 64.5 in | Wt 182.0 lb

## 2021-08-21 DIAGNOSIS — R7301 Impaired fasting glucose: Secondary | ICD-10-CM

## 2021-08-21 DIAGNOSIS — E039 Hypothyroidism, unspecified: Secondary | ICD-10-CM

## 2021-08-21 DIAGNOSIS — M810 Age-related osteoporosis without current pathological fracture: Secondary | ICD-10-CM

## 2021-08-21 MED ORDER — EUTHYROX 75 MCG PO TABS: 75.0000 ug | ORAL_TABLET | Freq: Every day | ORAL | 1 refills | Status: DC

## 2021-08-21 NOTE — Progress Notes (Signed)
Patient ID: Stephen Garcia, male   DOB: [birth year], 80 y.o.   MRN: [id number]   Reason for Appointment:  followup visit     History of Present Illness:    OSTEOPOROSIS: He has known vertebral compression fracture of L2 on his MRI which was diagnosed on x-rays for nonspecific back pain Initial lumbar compression fractures of L1, L2 were noticed in 2011 on a routine x-ray   Also has had lumbar disc disease and has had chronic low back pain He does not think his back pain is any worse and has not had any episodes of low back pain  He has a bone density done at baseline indicating osteoporosis.  T score was reportedly -3.0 in 2014  Because of his osteoporosis and history of fractures he previously had been on Fosamax weekly since 2014  He was told by the pulmonologist to stop this because of potential for creating reflux.  He was then given an infusion of Reclast on 07/31/2016, 09/01/17 and 08/2019 No side effects with this  He has had chronic low back pain as before   Last bone density: Results from 06/26/17 showed T score at lumbar spine -1.7 Left femoral neck -2.4  Vitamin D supplementation: He is taking 50,000 units as prescribed every Sunday His vitamin D level is consistently normal as below  Lab Results  Component Value Date   VD25OH 46.92 05/15/2021   VD25OH 54.78 08/14/2020   VD25OH 42.27 05/17/2019   VD25OH 50.21 11/09/2018     HYPOTHYROIDISM: was first diagnosed  several years ago Unclear what his initial symptoms at diagnosis were Previously  had been taking 75 g levothyroxine daily from his PCP  However his free T4 was low in 10/21 and dose was increased to 100 mcg   Previously felt better when the dose was increased However now he is taking this 6 times a week since his TSH was low in July  Has been taking [facility]ut[facility]rox from [facility]  No recent fatigue, heat or cold intolerance or significant weight change  He takes levothyroxine before breakfast daily    Although his free T4 is about the same his TSH is still suppressed at 0.06   Lab Results  Component Value Date   TSH 0.06 (L) 08/17/2021   TSH 0.04 (L) 05/15/2021   TSH 0.11 (L) 11/14/2020   FREET4 0.93 08/17/2021   FREET4 0.90 05/15/2021   FREET4 0.93 11/14/2020       Allergies as of 08/21/2021   No Known Allergies      Medication List        Accurate as of August 21, 2021  9:11 AM. If you have any questions, ask your nurse or doctor.          atorvastatin 20 MG tablet Commonly known as: LIPITOR   brimonidine 0.1 % Soln Commonly known as: ALPHAGAN P Place 1 drop into both eyes 2 times daily.   dorzolamide 2 % ophthalmic solution Commonly known as: TRUSOPT Place 1 drop into both eyes 3 (three) times daily.   ergocalciferol 1.25 MG (50000 UT) capsule Commonly known as: VITAMIN D2 Take 50,000 Units by mouth once a week.   Euthyrox 100 MCG tablet Generic drug: levothyroxine TAKE 1 TABLET BY MOUTH ONCE DAILY BEFORE BREAKFAST   Lumigan 0.01 % Soln Generic drug: bimatoprost   prednisoLONE acetate 1 % ophthalmic suspension Commonly known as: PRED FORTE 1 drop 4 (four) times daily.        Allergies: No  Known Allergies  Past Medical History:  Diagnosis Date   Glaucoma    Hyperlipidemia    Hypothyroidism     Past Surgical History:  Procedure Laterality Date   BACK SURGERY     TONSILLECTOMY      Family History  Problem Relation Age of Onset   Heart disease Mother    Asthma Sister     Social History:  reports that he has never smoked. He has never used smokeless tobacco. He reports current alcohol use. He reports that he does not use drugs.  REVIEW Of SYSTEMS:    IMPAIRED fasting glucose:  His fasting glucose is 99 fasting and has been as high as 108  A1c in prediabetic range done by PCP in 10/22, result was 6.1   Recent lab fasting glucose was 89  No results found for: HGBA1C Lab Results  Component Value Date   CREATININE  1.02 08/17/2021     Wt Readings from Last 3 Encounters:  08/21/21 182 lb (82.6 kg)  05/18/21 181 lb (82.1 kg)  11/17/20 181 lb 12.8 oz (82.5 kg)   Has chronic  hoarseness from reflux    Examination:   BP 130/72   Pulse (!) 50   Ht 5' 4.5" (1.638 m)   Wt 182 lb (82.6 kg)   SpO2 99%   BMI 30.76 kg/m      Assessment/Plan:    Osteoporosis. 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  Prediabetes: Although his A1c has been in prediabetic range consistently his glucose is now consistently below 100 and likely benefiting from his trying to modify his diet and keep portion small Encouraged him to exercise as much as possible   There are no Patient Instructions on file for this visit.   Reather Littler 08/21/2021, 9:11 AM

## 2021-08-21 NOTE — Patient Instructions (Addendum)
Skip 100 dose on Wednesdays and Sundays, next Rx will be 75ug daily

## 2021-08-28 ENCOUNTER — Ambulatory Visit (INDEPENDENT_AMBULATORY_CARE_PROVIDER_SITE_OTHER): Payer: Medicare Other | Admitting: Nutrition

## 2021-08-28 ENCOUNTER — Other Ambulatory Visit: Payer: Self-pay

## 2021-08-28 DIAGNOSIS — M810 Age-related osteoporosis without current pathological fracture: Secondary | ICD-10-CM

## 2021-08-28 NOTE — Progress Notes (Signed)
Per Dr. Remus Blake note on 08/21/21, and after the patient signed the consent, and IV was started in the patient's right arm.  2ccs of Normal saline was infused, and no signes of infiltration were noted.  At 3:55PM, 5mg . of Zolendronic acid was infused.   Patient denied any light headedness, or discomfort.  The IV was flushed with 2ccs of normal saline, and the needle was removed at 3:55PM .  The site showed no signes of redness or swelling.  He was encouraged to drink 4-5 glasses of water today and to continue to take his calcium ad Vit D Per Dr. orders.  He agreed to do this and had no final questions.

## 2021-08-28 NOTE — Patient Instructions (Signed)
Continue to take your calcium and Vit. D per Dr. Remus Blake orders Drink 4-5 glasses of water today. Call if questions

## 2021-10-08 ENCOUNTER — Other Ambulatory Visit (HOSPITAL_BASED_OUTPATIENT_CLINIC_OR_DEPARTMENT_OTHER): Payer: Self-pay

## 2021-10-08 ENCOUNTER — Other Ambulatory Visit: Payer: Self-pay

## 2021-10-08 ENCOUNTER — Ambulatory Visit: Payer: Medicare Other | Attending: Internal Medicine

## 2021-10-08 DIAGNOSIS — Z23 Encounter for immunization: Secondary | ICD-10-CM

## 2021-10-08 MED ORDER — PFIZER COVID-19 VAC BIVALENT 30 MCG/0.3ML IM SUSP
INTRAMUSCULAR | 0 refills | Status: DC
  Filled 2021-10-08: qty 0.3, 1d supply, fill #0

## 2021-10-08 NOTE — Progress Notes (Signed)
   Covid-19 Vaccination [facility]  Name:  Stephen Garcia    MRN: [id number] DOB: 01/19/1941  10/08/2021  [name] was observed post Covid-19 immunization for 15 minutes without incident. He was provided with Vaccine Information Sheet and instruction to access the V-Safe system.   [name] was instructed to call 911 with any severe reactions post vaccine: Difficulty breathing  Swelling of face and throat  A fast heartbeat  A bad rash all over body  Dizziness and weakness   Immunizations Administered     Name Date Dose VIS Date Route   Pfizer Covid-19 Vaccine Bivalent Booster 10/08/2021 10:44 AM 0.3 mL 07/11/2021 Intramuscular   Manufacturer: [employer], [employer]   Lot: [id number]   NDC: [phone]

## 2021-10-29 DIAGNOSIS — H402223 Chronic angle-closure glaucoma, left eye, severe stage: Secondary | ICD-10-CM | POA: Diagnosis not present

## 2021-10-29 DIAGNOSIS — H402213 Chronic angle-closure glaucoma, right eye, severe stage: Secondary | ICD-10-CM | POA: Diagnosis not present

## 2021-11-02 DIAGNOSIS — S46819A Strain of other muscles, fascia and tendons at shoulder and upper arm level, unspecified arm, initial encounter: Secondary | ICD-10-CM | POA: Diagnosis not present

## 2021-11-09 DIAGNOSIS — S161XXD Strain of muscle, fascia and tendon at neck level, subsequent encounter: Secondary | ICD-10-CM | POA: Diagnosis not present

## 2021-12-06 DIAGNOSIS — H5203 Hypermetropia, bilateral: Secondary | ICD-10-CM | POA: Diagnosis not present

## 2021-12-06 DIAGNOSIS — H524 Presbyopia: Secondary | ICD-10-CM | POA: Diagnosis not present

## 2021-12-06 DIAGNOSIS — H52203 Unspecified astigmatism, bilateral: Secondary | ICD-10-CM | POA: Diagnosis not present

## 2021-12-18 ENCOUNTER — Other Ambulatory Visit (INDEPENDENT_AMBULATORY_CARE_PROVIDER_SITE_OTHER): Payer: Medicare Other

## 2021-12-18 ENCOUNTER — Other Ambulatory Visit: Payer: Self-pay

## 2021-12-18 DIAGNOSIS — E039 Hypothyroidism, unspecified: Secondary | ICD-10-CM

## 2021-12-18 DIAGNOSIS — M810 Age-related osteoporosis without current pathological fracture: Secondary | ICD-10-CM | POA: Diagnosis not present

## 2021-12-18 LAB — BASIC METABOLIC PANEL
BUN: 12 mg/dL (ref 6–23)
CO2: 30 mEq/L (ref 19–32)
Calcium: 9.6 mg/dL (ref 8.4–10.5)
Chloride: 102 mEq/L (ref 96–112)
Creatinine, Ser: 1.13 mg/dL (ref 0.40–1.50)
GFR: 60.7 mL/min (ref >60.00)
Glucose, Bld: 95 mg/dL (ref 70–99)
Potassium: 4.4 mEq/L (ref 3.5–5.1)
Sodium: 137 mEq/L (ref 135–145)

## 2021-12-18 LAB — TSH: TSH: 0.19 u[IU]/mL — ABNORMAL LOW (ref 0.35–5.50)

## 2021-12-18 LAB — T4, FREE: Free T4: 0.75 ng/dL (ref 0.60–1.60)

## 2021-12-25 ENCOUNTER — Ambulatory Visit: Payer: Medicare Other | Admitting: Endocrinology

## 2021-12-25 ENCOUNTER — Encounter: Payer: Self-pay | Admitting: Endocrinology

## 2021-12-25 ENCOUNTER — Other Ambulatory Visit: Payer: Self-pay

## 2021-12-25 VITALS — BP 130/72 | HR 71 | Ht 65.0 in | Wt 187.0 lb

## 2021-12-25 DIAGNOSIS — E039 Hypothyroidism, unspecified: Secondary | ICD-10-CM

## 2021-12-25 DIAGNOSIS — R7301 Impaired fasting glucose: Secondary | ICD-10-CM

## 2021-12-25 DIAGNOSIS — E559 Vitamin D deficiency, unspecified: Secondary | ICD-10-CM | POA: Diagnosis not present

## 2021-12-25 DIAGNOSIS — M810 Age-related osteoporosis without current pathological fracture: Secondary | ICD-10-CM

## 2021-12-25 NOTE — Progress Notes (Signed)
Patient ID: Stephen Garcia, male   DOB: Jul 15, 1941, 81 y.o.   MRN: [id number]   Reason for Appointment:  followup visit     History of Present Illness:    OSTEOPOROSIS: He has known vertebral compression fracture of L2 on his MRI which was diagnosed on x-rays for nonspecific back pain Initial lumbar compression fractures of L1, L2 were noticed in 2011 on a routine x-ray   Also has had lumbar disc disease and has had chronic low back pain He does not think his back pain is any worse and has not had any episodes of low back pain  He has a bone density done at baseline indicating osteoporosis.  T score was reportedly -3.0 in 2014  Because of his osteoporosis and history of fractures he previously had been on Fosamax weekly since 2014  He was told by the pulmonologist to stop this because of potential for creating reflux.  He has been given an infusion of Reclast on 07/31/2016, 09/01/17, 08/2019 and 08/2021 No side effects when he gets the infusions  He has had chronic low back pain as before   Last bone density: Results from 06/26/17 showed T score at lumbar spine -1.7 Left femoral neck -2.4  Vitamin D supplementation: He is taking 50,000 units as prescribed every Sunday His vitamin D level is consistently normal as below  Lab Results  Component Value Date   VD25OH 46.92 05/15/2021   VD25OH 54.78 08/14/2020   VD25OH 42.27 05/17/2019   VD25OH 50.21 11/09/2018     HYPOTHYROIDISM: was first diagnosed  several years ago Unclear what his initial symptoms at diagnosis were Previously  had been taking 75 g levothyroxine daily from his PCP  However his free T4 was low in 10/21 and dose was increased to 100 mcg   Previously felt better when the dose was increased  Since he has had more suppressed TSH levels in 2022 is now back to 75 mcg levothyroxine supplement Has been taking Euthyrox from [employer] Has not felt any fatigue or sluggishness since his dose was reduced  He  takes levothyroxine before breakfast daily   Labs show TSH to be not suppressed, free T4 normal   Lab Results  Component Value Date   TSH 0.19 (L) 12/18/2021   TSH 0.06 (L) 08/17/2021   TSH 0.04 (L) 05/15/2021   FREET4 0.75 12/18/2021   FREET4 0.93 08/17/2021   FREET4 0.90 05/15/2021       Allergies as of 12/25/2021   No Known Allergies      Medication List        Accurate as of December 25, 2021  9:47 AM. If you have any questions, ask your nurse or doctor.          atorvastatin 20 MG tablet Commonly known as: LIPITOR   brimonidine 0.1 % Soln Commonly known as: ALPHAGAN P Place 1 drop into both eyes 2 times daily.   dorzolamide 2 % ophthalmic solution Commonly known as: TRUSOPT Place 1 drop into both eyes 3 (three) times daily.   ergocalciferol 1.25 MG (50000 UT) capsule Commonly known as: VITAMIN D2 Take 50,000 Units by mouth once a week.   Euthyrox 75 MCG tablet Generic drug: levothyroxine Take 1 tablet (75 mcg total) by mouth daily before breakfast.   Lumigan 0.01 % Soln Generic drug: bimatoprost   Pfizer COVID-19 Vac Bivalent injection Generic drug: COVID-19 mRNA bivalent vaccine (Pfizer) Inject into the muscle.   prednisoLONE acetate 1 % ophthalmic suspension  Commonly known as: PRED FORTE 1 drop 4 (four) times daily.        Allergies: No Known Allergies  Past Medical History:  Diagnosis Date   Glaucoma    Hyperlipidemia    Hypothyroidism     Past Surgical History:  Procedure Laterality Date   BACK SURGERY     TONSILLECTOMY      Family History  Problem Relation Age of Onset   Heart disease Mother    Asthma Sister     Social History:  reports that he has never smoked. He has never used smokeless tobacco. He reports current alcohol use. He reports that he does not use drugs.  REVIEW Of SYSTEMS:    IMPAIRED fasting glucose:  His fasting glucose is 95 fasting and has been as high as 108  A1c in prediabetic range done by  PCP in 10/22, result was 6.1    No results found for: HGBA1C Lab Results  Component Value Date   CREATININE 1.13 12/18/2021     Wt Readings from Last 3 Encounters:  12/25/21 187 lb (84.8 kg)  08/21/21 182 lb (82.6 kg)  05/18/21 181 lb (82.1 kg)   Has chronic  hoarseness from reflux    Examination:   BP 130/72    Pulse 71    Ht 5\' 5"  (1.651 m)    Wt 187 lb (84.8 kg)    SpO2 95%    BMI 31.12 kg/m    Thyroid not palpable Biceps reflexes show normal relaxation  No tenderness or deformity of the thoracic or lumbar spine    Assessment/Plan:    Osteoporosis. 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   Stephen Garcia 12/25/2021, 9:47 AM

## 2022-01-14 DIAGNOSIS — R519 Headache, unspecified: Secondary | ICD-10-CM | POA: Diagnosis not present

## 2022-01-14 DIAGNOSIS — M25551 Pain in right hip: Secondary | ICD-10-CM | POA: Diagnosis not present

## 2022-01-14 DIAGNOSIS — M792 Neuralgia and neuritis, unspecified: Secondary | ICD-10-CM | POA: Diagnosis not present

## 2022-01-17 ENCOUNTER — Encounter: Payer: Self-pay | Admitting: Neurology

## 2022-02-01 DIAGNOSIS — M5136 Other intervertebral disc degeneration, lumbar region: Secondary | ICD-10-CM | POA: Diagnosis not present

## 2022-03-07 DIAGNOSIS — H402233 Chronic angle-closure glaucoma, bilateral, severe stage: Secondary | ICD-10-CM | POA: Diagnosis not present

## 2022-03-20 NOTE — Progress Notes (Signed)
? ?NEUROLOGY CONSULTATION NOTE ? ?[name] ?MRN: [id number] ?DOB: Dec 04, 1940 ? ?Referring provider: Tally Joe, MD ?Primary care provider: Tally Joe, MD ? ?Reason for consult:  headache ? ?Assessment/Plan:  ? ?Cervicogenic headache ?Cervical spondylosis/degenerative disc disease ? ? ?1  Previously did well on gabapentin.  Restart gabapentin 300mg  at bedtime ?2  Follow up 4 months. ? ? ?Subjective:  ?Stephen Garcia is an 81 year old right-handed man with glaucoma, hypercholesterolemia, and degenerative disc disease who presents for headaches.  History, including status update, supplemented by his wife, who accompanies him. ? ? ?Last seen in 2018 for postconcussion headaches.  Subsequently resolved.  He started having new headaches in December.  He woke up one morning with feeling of a crick in his neck.  He describes a throbbing/pressure  from the right greater than left occipital region radiating down the right side of his neck and into the shoulder.  Worse laying down and with neck movements.  Initially thought to be secondary to right trapezius strain.  Treated with diclofenac and methocarbamol.  He was on a short course of prednisone but stopped due to his glaucoma.  Symptoms persisted.  No extremity pain, numbness or weakness.   Occurs daily. ?  ?CT cervical spine on 05/22/2016 personally reviewed showed degenerative disc and facet disease from C3-4 through C6-7. ? ?PAST MEDICAL HISTORY: ?Past Medical History:  ?Diagnosis Date  ? Glaucoma   ? Hyperlipidemia   ? Hypothyroidism   ? ? ?PAST SURGICAL HISTORY: ?Past Surgical History:  ?Procedure Laterality Date  ? BACK SURGERY    ? TONSILLECTOMY    ? ? ?MEDICATIONS: ?Current Outpatient Medications on File Prior to Visit  ?Medication Sig Dispense Refill  ? atorvastatin (LIPITOR) 20 MG tablet     ? brimonidine (ALPHAGAN P) 0.1 % SOLN Place 1 drop into both eyes 2 times daily.    ? COVID-19 mRNA bivalent vaccine, Pfizer, (PFIZER COVID-19 VAC BIVALENT) injection Inject  into the muscle. 0.3 mL 0  ? dorzolamide (TRUSOPT) 2 % ophthalmic solution Place 1 drop into both eyes 3 (three) times daily.    ? ergocalciferol (VITAMIN D2) 50000 UNITS capsule Take 50,000 Units by mouth once a week.    ? EUTHYROX 75 MCG tablet Take 1 tablet (75 mcg total) by mouth daily before breakfast. 90 tablet 1  ? LUMIGAN 0.01 % SOLN     ? prednisoLONE acetate (PRED FORTE) 1 % ophthalmic suspension 1 drop 4 (four) times daily.    ? ?No current facility-administered medications on file prior to visit.  ? ? ?ALLERGIES: ?No Known Allergies ? ?FAMILY HISTORY: ?Family History  ?Problem Relation Age of Onset  ? Heart disease Mother   ? Asthma Sister   ? ? ?Objective:  ?Blood pressure (!) 144/67, pulse 69, height 5\' 5"  (1.651 m), weight 184 lb 12.8 oz (83.8 kg), SpO2 99 %. ?General: No acute distress.  Patient appears well-groomed.   ?Head:  Normocephalic/atraumatic ?Eyes:  fundi examined but not visualized ?Neck: supple, bilateral paraspinal tenderness and right suboccipital tenderness, full range of motion ?Heart: regular rate and rhythm ?Lungs: Clear to auscultation bilaterally. ?Vascular: No carotid bruits. ?Neurological Exam: ?Mental status: alert and oriented to person, place, and time, recent and remote memory intact, fund of knowledge intact, attention and concentration intact, speech fluent and not dysarthric, language intact. ?Cranial nerves: ?CN I: not tested ?CN II: pupils equal, round and reactive to light, visual fields intact ?CN III, IV, VI:  full range of motion, no nystagmus,  no ptosis ?CN V: facial sensation intact. ?CN VII: upper and lower face symmetric ?CN VIII: hearing intact ?CN IX, X: gag intact, uvula midline ?CN XI: sternocleidomastoid and trapezius muscles intact ?CN XII: tongue midline ?Bulk & Tone: normal, no fasciculations. ?Motor:  muscle strength 5/5 throughout ?Sensation:  Temperature and vibratory sensation intact. ?Deep Tendon Reflexes:  2+ throughout,  toes downgoing.   ?Finger  to nose testing:  Without dysmetria.   ?Heel to shin:  Without dysmetria.   ?Gait:  Normal station and stride.  Romberg negative. ? ? ? ?Thank you for allowing me to take part in the care of this patient. ? ?Shon Millet, DO ? ?CC: Tally Joe, MD ? ? ? ? ?

## 2022-03-21 ENCOUNTER — Ambulatory Visit: Payer: Medicare Other | Admitting: Neurology

## 2022-03-21 ENCOUNTER — Encounter: Payer: Self-pay | Admitting: Neurology

## 2022-03-21 VITALS — BP 144/67 | HR 69 | Ht 65.0 in | Wt 184.8 lb

## 2022-03-21 DIAGNOSIS — M5030 Other cervical disc degeneration, unspecified cervical region: Secondary | ICD-10-CM | POA: Diagnosis not present

## 2022-03-21 DIAGNOSIS — G4486 Cervicogenic headache: Secondary | ICD-10-CM | POA: Diagnosis not present

## 2022-03-21 MED ORDER — GABAPENTIN 300 MG PO CAPS: 300.0000 mg | ORAL_CAPSULE | Freq: Every day | ORAL | 5 refills | Status: DC

## 2022-03-21 NOTE — Patient Instructions (Signed)
I think the headaches are due to arthritis in the neck ?Start gabapentin 300mg  at bedtime.  We can increase dose if needed ?Follow up in 4 months. ?

## 2022-03-25 ENCOUNTER — Telehealth: Payer: Self-pay | Admitting: Neurology

## 2022-03-25 DIAGNOSIS — Z1211 Encounter for screening for malignant neoplasm of colon: Secondary | ICD-10-CM | POA: Diagnosis not present

## 2022-03-25 DIAGNOSIS — E039 Hypothyroidism, unspecified: Secondary | ICD-10-CM | POA: Diagnosis not present

## 2022-03-25 DIAGNOSIS — M810 Age-related osteoporosis without current pathological fracture: Secondary | ICD-10-CM | POA: Diagnosis not present

## 2022-03-25 DIAGNOSIS — M1990 Unspecified osteoarthritis, unspecified site: Secondary | ICD-10-CM | POA: Diagnosis not present

## 2022-03-25 DIAGNOSIS — R7303 Prediabetes: Secondary | ICD-10-CM | POA: Diagnosis not present

## 2022-03-25 DIAGNOSIS — R053 Chronic cough: Secondary | ICD-10-CM | POA: Diagnosis not present

## 2022-03-25 DIAGNOSIS — Z0000 Encounter for general adult medical examination without abnormal findings: Secondary | ICD-10-CM | POA: Diagnosis not present

## 2022-03-25 DIAGNOSIS — E782 Mixed hyperlipidemia: Secondary | ICD-10-CM | POA: Diagnosis not present

## 2022-03-25 DIAGNOSIS — E559 Vitamin D deficiency, unspecified: Secondary | ICD-10-CM | POA: Diagnosis not present

## 2022-03-25 DIAGNOSIS — M549 Dorsalgia, unspecified: Secondary | ICD-10-CM | POA: Diagnosis not present

## 2022-03-25 DIAGNOSIS — G4486 Cervicogenic headache: Secondary | ICD-10-CM | POA: Diagnosis not present

## 2022-03-25 NOTE — Telephone Encounter (Signed)
Pt called in and left a message with the access nurse. Pt stated he was prescribed gabapentin, but he is not sure why he was prescribed this medication.He would like to speak with someone about it. ?

## 2022-03-25 NOTE — Telephone Encounter (Signed)
Pt call and was told that he was taking Gabapentin for headaches and arthritis of the neck. He understood.  ?

## 2022-04-02 DIAGNOSIS — H04123 Dry eye syndrome of bilateral lacrimal glands: Secondary | ICD-10-CM | POA: Diagnosis not present

## 2022-04-02 DIAGNOSIS — H52203 Unspecified astigmatism, bilateral: Secondary | ICD-10-CM | POA: Diagnosis not present

## 2022-04-02 DIAGNOSIS — H26491 Other secondary cataract, right eye: Secondary | ICD-10-CM | POA: Diagnosis not present

## 2022-04-02 DIAGNOSIS — H401133 Primary open-angle glaucoma, bilateral, severe stage: Secondary | ICD-10-CM | POA: Diagnosis not present

## 2022-04-04 DIAGNOSIS — H402223 Chronic angle-closure glaucoma, left eye, severe stage: Secondary | ICD-10-CM | POA: Diagnosis not present

## 2022-04-04 DIAGNOSIS — H402213 Chronic angle-closure glaucoma, right eye, severe stage: Secondary | ICD-10-CM | POA: Diagnosis not present

## 2022-05-10 ENCOUNTER — Ambulatory Visit: Payer: Medicare Other | Admitting: Neurology

## 2022-05-27 DIAGNOSIS — H402213 Chronic angle-closure glaucoma, right eye, severe stage: Secondary | ICD-10-CM | POA: Diagnosis not present

## 2022-05-27 DIAGNOSIS — H402223 Chronic angle-closure glaucoma, left eye, severe stage: Secondary | ICD-10-CM | POA: Diagnosis not present

## 2022-06-20 ENCOUNTER — Other Ambulatory Visit (INDEPENDENT_AMBULATORY_CARE_PROVIDER_SITE_OTHER): Payer: Medicare Other

## 2022-06-20 DIAGNOSIS — E039 Hypothyroidism, unspecified: Secondary | ICD-10-CM

## 2022-06-20 DIAGNOSIS — E559 Vitamin D deficiency, unspecified: Secondary | ICD-10-CM | POA: Diagnosis not present

## 2022-06-20 DIAGNOSIS — R7301 Impaired fasting glucose: Secondary | ICD-10-CM

## 2022-06-20 DIAGNOSIS — M810 Age-related osteoporosis without current pathological fracture: Secondary | ICD-10-CM

## 2022-06-20 LAB — VITAMIN D 25 HYDROXY (VIT D DEFICIENCY, FRACTURES): VITD: 37.24 ng/mL (ref 30.00–100.00)

## 2022-06-20 LAB — BASIC METABOLIC PANEL
BUN: 11 mg/dL (ref 6–23)
CO2: 27 mEq/L (ref 19–32)
Calcium: 9.1 mg/dL (ref 8.4–10.5)
Chloride: 99 mEq/L (ref 96–112)
Creatinine, Ser: 1.2 mg/dL (ref 0.40–1.50)
GFR: 56.28 mL/min — ABNORMAL LOW (ref >60.00)
Glucose, Bld: 92 mg/dL (ref 70–99)
Potassium: 4 mEq/L (ref 3.5–5.1)
Sodium: 137 mEq/L (ref 135–145)

## 2022-06-20 LAB — T4, FREE: Free T4: 0.58 ng/dL — ABNORMAL LOW (ref 0.60–1.60)

## 2022-06-20 LAB — TSH: TSH: 0.5 u[IU]/mL (ref 0.35–5.50)

## 2022-06-27 ENCOUNTER — Encounter: Payer: Self-pay | Admitting: Endocrinology

## 2022-06-27 ENCOUNTER — Ambulatory Visit: Payer: Medicare Other | Admitting: Endocrinology

## 2022-06-27 VITALS — BP 130/60 | HR 66 | Ht 63.25 in | Wt 184.2 lb

## 2022-06-27 DIAGNOSIS — M810 Age-related osteoporosis without current pathological fracture: Secondary | ICD-10-CM | POA: Diagnosis not present

## 2022-06-27 DIAGNOSIS — E559 Vitamin D deficiency, unspecified: Secondary | ICD-10-CM

## 2022-06-27 DIAGNOSIS — E039 Hypothyroidism, unspecified: Secondary | ICD-10-CM

## 2022-06-27 NOTE — Patient Instructions (Signed)
Thyroid pill take 1 extra on Fridays

## 2022-06-27 NOTE — Progress Notes (Signed)
Patient ID: Stephen Garcia, male   DOB: [birth year], 81 y.o.   MRN: [id number]   Reason for Appointment:  followup visit     History of Present Illness:    OSTEOPOROSIS: He has known vertebral compression fracture of L2 on his MRI which was diagnosed on x-rays for nonspecific back pain Initial lumbar compression fractures of L1, L2 were noticed in 2011 on a routine x-ray   Also has had lumbar disc disease and has had chronic low back pain He does not think his back pain is any worse and has not had any episodes of low back pain  He has a bone density done at baseline indicating osteoporosis.  T score was reportedly -3.0 in 2014  Because of his osteoporosis and history of fractures he previously had been on Fosamax weekly since 2014  He was told by the pulmonologist to stop this because of potential for creating reflux.  He has been given an infusion of Reclast on 07/31/2016, 09/01/17, 08/2019 and 08/2021 No side effects when he gets the infusions  He has had chronic low back pain from osteoarthritis and has difficulty with other joint pains continuing   Last bone density: Results from 06/26/17 showed T score at lumbar spine -1.7 Left femoral neck -2.4  Vitamin D supplementation: He is taking 50,000 units as prescribed every Sunday His vitamin D level is consistently normal as below  Lab Results  Component Value Date   VD25OH 37.24 06/20/2022   VD25OH 46.92 05/15/2021   VD25OH 54.78 08/14/2020   VD25OH 42.27 05/17/2019     HYPOTHYROIDISM, probably SECONDARY: This was first diagnosed  several years ago Unclear what his initial symptoms at diagnosis were Previously  had been taking 75 g levothyroxine daily from his PCP When his free T4 was low in 10/21 and dose was increased to 100 mcg  Subsequently with suppressed TSH levels in 2022 was changed back to 75 mg  However unclear why he is continuing to be getting the 100 mcg prescription instead of 75  Taking generic  now Now for the last 2 weeks or so he says that he has been having more tiredness and fatigability His weight is down slightly from earlier this year  He takes levothyroxine before breakfast daily and has not missed any doses  Labs show TSH to be back to be normal but his free T4 is below normal now which is unusual recently    Lab Results  Component Value Date   TSH 0.50 06/20/2022   TSH 0.19 (L) 12/18/2021   TSH 0.06 (L) 08/17/2021   FREET4 0.58 (L) 06/20/2022   FREET4 0.75 12/18/2021   FREET4 0.93 08/17/2021       Allergies as of 06/27/2022   No Known Allergies      Medication List        Accurate as of June 27, 2022  9:29 AM. If you have any questions, ask your nurse or doctor.          atorvastatin 20 MG tablet Commonly known as: LIPITOR   brimonidine 0.1 % Soln Commonly known as: ALPHAGAN P Place 1 drop into both eyes 2 times daily.   dorzolamide 2 % ophthalmic solution Commonly known as: TRUSOPT Place 1 drop into both eyes 3 (three) times daily.   ergocalciferol 1.25 MG (50000 UT) capsule Commonly known as: VITAMIN D2 Take 50,000 Units by mouth once a week.   Euthyrox 75 MCG tablet Generic drug: levothyroxine Take 1 tablet (  75 mcg total) by mouth daily before breakfast.   levothyroxine 100 MCG tablet Commonly known as: SYNTHROID Take 100 mcg by mouth every morning.   gabapentin 300 MG capsule Commonly known as: NEURONTIN Take 1 capsule (300 mg total) by mouth at bedtime.   Lumigan 0.01 % Soln Generic drug: bimatoprost   Pfizer COVID-19 Vac Bivalent injection Generic drug: COVID-19 mRNA bivalent vaccine (Pfizer) Inject into the muscle.   pilocarpine 1 % ophthalmic solution Commonly known as: PILOCAR INSTILL 1 DROP INTO LEFT EYE 4 TIMES DAILY   prednisoLONE acetate 1 % ophthalmic suspension Commonly known as: PRED FORTE 1 drop 4 (four) times daily.   Rocklatan 0.02-0.005 % Soln Generic drug: Netarsudil-Latanoprost SMARTSIG:1 In  Eye(s) Daily        Allergies: No Known Allergies  Past Medical History:  Diagnosis Date   Glaucoma    Hyperlipidemia    Hypothyroidism     Past Surgical History:  Procedure Laterality Date   BACK SURGERY     TONSILLECTOMY      Family History  Problem Relation Age of Onset   Heart disease Mother    Dementia Sister    Asthma Sister    Dementia Sister     Social History:  reports that he has never smoked. He has never used smokeless tobacco. He reports current alcohol use. He reports that he does not use drugs.  REVIEW Of SYSTEMS:    IMPAIRED fasting glucose:  His fasting glucose is 95 fasting and has been as high as 108  A1c in prediabetic range done by PCP in 10/22, result was 6.1    No results found for: "HGBA1C" Lab Results  Component Value Date   CREATININE 1.20 06/20/2022     Wt Readings from Last 3 Encounters:  06/27/22 184 lb 3.2 oz (83.6 kg)  03/21/22 184 lb 12.8 oz (83.8 kg)  12/25/21 187 lb (84.8 kg)   Has chronic  hoarseness from reflux    Examination:   BP 130/60   Pulse 66   Ht 5' 3.25" (1.607 m)   Wt 184 lb 3.2 oz (83.6 kg)   SpO2 98%   BMI 32.37 kg/m    Biceps reflexes show normal relaxation     Assessment/Plan:    Osteoporosis. 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   Reather Littler 06/27/2022, 9:29 AM

## 2022-09-05 DIAGNOSIS — H402223 Chronic angle-closure glaucoma, left eye, severe stage: Secondary | ICD-10-CM | POA: Diagnosis not present

## 2022-09-05 DIAGNOSIS — H402213 Chronic angle-closure glaucoma, right eye, severe stage: Secondary | ICD-10-CM | POA: Diagnosis not present

## 2022-09-10 ENCOUNTER — Other Ambulatory Visit: Payer: Self-pay | Admitting: Endocrinology

## 2022-09-20 DIAGNOSIS — H402233 Chronic angle-closure glaucoma, bilateral, severe stage: Secondary | ICD-10-CM | POA: Diagnosis not present

## 2022-10-07 ENCOUNTER — Other Ambulatory Visit: Payer: Self-pay

## 2022-10-07 DIAGNOSIS — R7303 Prediabetes: Secondary | ICD-10-CM | POA: Diagnosis not present

## 2022-10-07 DIAGNOSIS — E559 Vitamin D deficiency, unspecified: Secondary | ICD-10-CM | POA: Diagnosis not present

## 2022-10-07 DIAGNOSIS — M1990 Unspecified osteoarthritis, unspecified site: Secondary | ICD-10-CM | POA: Diagnosis not present

## 2022-10-07 DIAGNOSIS — R3915 Urgency of urination: Secondary | ICD-10-CM | POA: Diagnosis not present

## 2022-10-07 DIAGNOSIS — E039 Hypothyroidism, unspecified: Secondary | ICD-10-CM | POA: Diagnosis not present

## 2022-10-07 DIAGNOSIS — R053 Chronic cough: Secondary | ICD-10-CM | POA: Diagnosis not present

## 2022-10-07 DIAGNOSIS — Z23 Encounter for immunization: Secondary | ICD-10-CM | POA: Diagnosis not present

## 2022-10-07 DIAGNOSIS — M549 Dorsalgia, unspecified: Secondary | ICD-10-CM | POA: Diagnosis not present

## 2022-10-07 DIAGNOSIS — M810 Age-related osteoporosis without current pathological fracture: Secondary | ICD-10-CM | POA: Diagnosis not present

## 2022-10-07 DIAGNOSIS — E782 Mixed hyperlipidemia: Secondary | ICD-10-CM | POA: Diagnosis not present

## 2022-10-07 DIAGNOSIS — G4486 Cervicogenic headache: Secondary | ICD-10-CM | POA: Diagnosis not present

## 2022-10-07 MED ORDER — LEVOTHYROXINE SODIUM 100 MCG PO TABS: 100.0000 ug | ORAL_TABLET | Freq: Every day | ORAL | 2 refills | Status: DC

## 2022-10-10 ENCOUNTER — Other Ambulatory Visit (INDEPENDENT_AMBULATORY_CARE_PROVIDER_SITE_OTHER): Payer: Medicare Other

## 2022-10-10 DIAGNOSIS — E039 Hypothyroidism, unspecified: Secondary | ICD-10-CM | POA: Diagnosis not present

## 2022-10-10 LAB — T4, FREE: Free T4: 0.83 ng/dL (ref 0.60–1.60)

## 2022-10-10 LAB — TSH: TSH: 0.1 u[IU]/mL — ABNORMAL LOW (ref 0.35–5.50)

## 2022-10-15 ENCOUNTER — Ambulatory Visit: Payer: Medicare Other | Admitting: Endocrinology

## 2022-10-15 ENCOUNTER — Encounter: Payer: Self-pay | Admitting: Endocrinology

## 2022-10-15 VITALS — BP 130/70 | HR 56 | Ht 64.5 in | Wt 182.4 lb

## 2022-10-15 DIAGNOSIS — E039 Hypothyroidism, unspecified: Secondary | ICD-10-CM

## 2022-10-15 MED ORDER — LEVOTHYROXINE SODIUM 112 MCG PO TABS: 112.0000 ug | ORAL_TABLET | Freq: Every day | ORAL | 3 refills | Status: DC

## 2022-10-15 NOTE — Progress Notes (Signed)
Patient ID: Stephen Garcia, male   DOB: 1941/05/29, 81 y.o.   MRN: [id number]   Reason for Appointment:  followup visit     History of Present Illness:    OSTEOPOROSIS: The following is a copy of the previous note He has known vertebral compression fracture of L2 on his MRI which was diagnosed on x-rays for nonspecific back pain Initial lumbar compression fractures of L1, L2 were noticed in 2011 on a routine x-ray   Also has had lumbar disc disease and has had chronic low back pain He does not think his back pain is any worse and has not had any episodes of low back pain  He has a bone density done at baseline indicating osteoporosis.  T score was reportedly -3.0 in 2014  Because of his osteoporosis and history of fractures he previously had been on Fosamax weekly since 2014  He was told by the pulmonologist to stop this because of potential for creating reflux.  He has been given an infusion of Reclast on 07/31/2016, 09/01/17, 08/2019 and 08/2021 No side effects when he gets the infusions  He has had chronic low back pain from osteoarthritis and has difficulty with other joint pains continuing   Last bone density: Results from 06/26/17 showed T score at lumbar spine -1.7 Left femoral neck -2.4  Vitamin D supplementation: He is taking 50,000 units as prescribed every Sunday His vitamin D level is consistently normal as below  Lab Results  Component Value Date   VD25OH 37.24 06/20/2022   VD25OH 46.92 05/15/2021   VD25OH 54.78 08/14/2020   VD25OH 42.27 05/17/2019    HYPOTHYROIDISM, probably SECONDARY: This was first diagnosed  several years ago Unclear what his initial symptoms at diagnosis were Previously  had been taking 75 g levothyroxine daily from his PCP When his free T4 was low in 10/21 and dose was increased to= 100 mcg  Subsequently with suppressed TSH levels in 2022 was supposed to go back to 75 mg  However for several months prior to his last visit he was  still getting 100 mcg levothyroxine For about 2 weeks prior to his last visit he was having more tiredness and fatigability He had taken the levothyroxine regularly for eating as before  Since his free T4 was low he is taking an extra 100 mcg levothyroxine weekly on Fridays With this he feels better and is not as tired He just had a prescription filled and did not let us know when he was running out of the 100 mcg dose No cold intolerance   Lab Results  Component Value Date   TSH 0.10 (L) 10/10/2022   TSH 0.50 06/20/2022   TSH 0.19 (L) 12/18/2021   FREET4 0.83 10/10/2022   FREET4 0.58 (L) 06/20/2022   FREET4 0.75 12/18/2021       Allergies as of 10/15/2022   No Known Allergies      Medication List        Accurate as of October 15, 2022  9:14 AM. If you have any questions, ask your nurse or doctor.          atorvastatin 20 MG tablet Commonly known as: LIPITOR   brimonidine 0.1 % Soln Commonly known as: ALPHAGAN P Place 1 drop into both eyes 2 times daily.   dorzolamide 2 % ophthalmic solution Commonly known as: TRUSOPT Place 1 drop into both eyes 3 (three) times daily.   ergocalciferol 1.25 MG (50000 UT) capsule Commonly known as: VITAMIN  D2 Take 50,000 Units by mouth once a week.   Euthyrox 75 MCG tablet Generic drug: levothyroxine Take 1 tablet (75 mcg total) by mouth daily before breakfast.   levothyroxine 100 MCG tablet Commonly known as: SYNTHROID Take 1 tablet (100 mcg total) by mouth daily before breakfast.   gabapentin 300 MG capsule Commonly known as: NEURONTIN Take 1 capsule (300 mg total) by mouth at bedtime.   Lumigan 0.01 % Soln Generic drug: bimatoprost   Pfizer COVID-19 Vac Bivalent injection Generic drug: COVID-19 mRNA bivalent vaccine (Pfizer) Inject into the muscle.   pilocarpine 1 % ophthalmic solution Commonly known as: PILOCAR INSTILL 1 DROP INTO LEFT EYE 4 TIMES DAILY   prednisoLONE acetate 1 % ophthalmic  suspension Commonly known as: PRED FORTE 1 drop 4 (four) times daily.   Rocklatan 0.02-0.005 % Soln Generic drug: Netarsudil-Latanoprost SMARTSIG:1 In Eye(s) Daily        Allergies: No Known Allergies  Past Medical History:  Diagnosis Date   Glaucoma    Hyperlipidemia    Hypothyroidism     Past Surgical History:  Procedure Laterality Date   BACK SURGERY     TONSILLECTOMY      Family History  Problem Relation Age of Onset   Heart disease Mother    Dementia Sister    Asthma Sister    Dementia Sister     Social History:  reports that he has never smoked. He has never used smokeless tobacco. He reports current alcohol use. He reports that he does not use drugs.  REVIEW Of SYSTEMS:    IMPAIRED fasting glucose:  His fasting glucose is 95 fasting and has been as high as 108  A1c in prediabetic range done by PCP in 10/22, result was 6.1    No results found for: "HGBA1C" Lab Results  Component Value Date   CREATININE 1.20 06/20/2022     Wt Readings from Last 3 Encounters:  10/15/22 182 lb 6.4 oz (82.7 kg)  06/27/22 184 lb 3.2 oz (83.6 kg)  03/21/22 184 lb 12.8 oz (83.8 kg)   Has chronic  hoarseness from reflux    Examination:   BP 130/70   Pulse (!) 56   Ht 5' 4.5" (1.638 m)   Wt 182 lb 6.4 oz (82.7 kg)   SpO2 98%   BMI 30.83 kg/m     No peripheral edema    Assessment/Plan:      Hypothyroidism, 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  He had fatigue with his free T4 being low on the last visit Now taking the equivalent of 114 mcg daily of levothyroxine he feels better and free T4 is back to normal He is quite confused about what dose of levothyroxine he should be taking and this was discussed in detail  He will continue the same prescription of 100 mcg since he had a 90-day supply and take extra 1 pill weekly He needs to take it before breakfast every day as before Written prescription given for the 112 micrograms doses that he will start  taking when he finishes the 100 mcg supply in February Follow-up in 4 months      There are no Patient Instructions on file for this visit.   Reather Littler 10/15/2022, 9:14 AM

## 2022-10-28 DIAGNOSIS — H402223 Chronic angle-closure glaucoma, left eye, severe stage: Secondary | ICD-10-CM | POA: Diagnosis not present

## 2022-10-28 DIAGNOSIS — H402213 Chronic angle-closure glaucoma, right eye, severe stage: Secondary | ICD-10-CM | POA: Diagnosis not present

## 2022-11-08 ENCOUNTER — Ambulatory Visit
Admission: EM | Admit: 2022-11-08 | Discharge: 2022-11-08 | Disposition: A | Discharge status: Home / Self Care | Payer: Medicare Other | Attending: Internal Medicine | Admitting: Internal Medicine

## 2022-11-08 ENCOUNTER — Encounter (HOSPITAL_COMMUNITY): Payer: Self-pay

## 2022-11-08 ENCOUNTER — Emergency Department (HOSPITAL_COMMUNITY): Payer: Medicare Other

## 2022-11-08 ENCOUNTER — Other Ambulatory Visit: Payer: Self-pay

## 2022-11-08 ENCOUNTER — Emergency Department (HOSPITAL_COMMUNITY)
Admission: EM | Admit: 2022-11-08 | Discharge: 2022-11-08 | Disposition: A | Discharge status: Home / Self Care | Payer: Medicare Other | Attending: Emergency Medicine | Admitting: Emergency Medicine

## 2022-11-08 DIAGNOSIS — M79602 Pain in left arm: Secondary | ICD-10-CM | POA: Diagnosis not present

## 2022-11-08 DIAGNOSIS — M5410 Radiculopathy, site unspecified: Secondary | ICD-10-CM

## 2022-11-08 DIAGNOSIS — R0789 Other chest pain: Secondary | ICD-10-CM | POA: Diagnosis not present

## 2022-11-08 DIAGNOSIS — R209 Unspecified disturbances of skin sensation: Secondary | ICD-10-CM | POA: Diagnosis not present

## 2022-11-08 DIAGNOSIS — M5412 Radiculopathy, cervical region: Secondary | ICD-10-CM | POA: Diagnosis not present

## 2022-11-08 DIAGNOSIS — I499 Cardiac arrhythmia, unspecified: Secondary | ICD-10-CM | POA: Diagnosis not present

## 2022-11-08 DIAGNOSIS — Z743 Need for continuous supervision: Secondary | ICD-10-CM | POA: Diagnosis not present

## 2022-11-08 DIAGNOSIS — R200 Anesthesia of skin: Secondary | ICD-10-CM

## 2022-11-08 DIAGNOSIS — R001 Bradycardia, unspecified: Secondary | ICD-10-CM | POA: Diagnosis not present

## 2022-11-08 DIAGNOSIS — M79603 Pain in arm, unspecified: Secondary | ICD-10-CM | POA: Diagnosis not present

## 2022-11-08 DIAGNOSIS — R6889 Other general symptoms and signs: Secondary | ICD-10-CM | POA: Diagnosis not present

## 2022-11-08 LAB — CBC
HCT: 44.5 % (ref 39.0–52.0)
Hemoglobin: 15.1 g/dL (ref 13.0–17.0)
MCH: 30.3 pg (ref 26.0–34.0)
MCHC: 33.9 g/dL (ref 30.0–36.0)
MCV: 89.2 fL (ref 80.0–100.0)
Platelets: 175 10*3/uL (ref 150–400)
RBC: 4.99 MIL/uL (ref 4.22–5.81)
RDW: 12.7 % (ref 11.5–15.5)
WBC: 4.4 10*3/uL (ref 4.0–10.5)
nRBC: 0 % (ref 0.0–0.2)

## 2022-11-08 LAB — TROPONIN I (HIGH SENSITIVITY)
Troponin I (High Sensitivity): 3 ng/L (ref <18)
Troponin I (High Sensitivity): <2 ng/L (ref <18)

## 2022-11-08 LAB — BASIC METABOLIC PANEL
Anion gap: 8 (ref 5–15)
BUN: 9 mg/dL (ref 8–23)
CO2: 23 mmol/L (ref 22–32)
Calcium: 9 mg/dL (ref 8.9–10.3)
Chloride: 106 mmol/L (ref 98–111)
Creatinine, Ser: 0.95 mg/dL (ref 0.61–1.24)
GFR, Estimated: >60 mL/min (ref >60)
Glucose, Bld: 94 mg/dL (ref 70–99)
Potassium: 3.9 mmol/L (ref 3.5–5.1)
Sodium: 137 mmol/L (ref 135–145)

## 2022-11-08 MED ORDER — ASPIRIN 81 MG PO CHEW
324.0000 mg | CHEWABLE_TABLET | Freq: Once | ORAL | Status: AC
  Administered 2022-11-08: 324 mg via ORAL

## 2022-11-08 MED ORDER — KETOROLAC TROMETHAMINE 30 MG/ML IJ SOLN
30.0000 mg | Freq: Once | INTRAMUSCULAR | Status: AC
  Administered 2022-11-08: 30 mg via INTRAVENOUS
  Filled 2022-11-08: qty 1

## 2022-11-08 MED ORDER — OXYCODONE-ACETAMINOPHEN 5-325 MG PO TABS: 1.0000 | ORAL_TABLET | Freq: Three times a day (TID) | ORAL | 0 refills | Status: DC | PRN

## 2022-11-08 MED ORDER — DEXAMETHASONE SODIUM PHOSPHATE 10 MG/ML IJ SOLN
8.0000 mg | Freq: Once | INTRAMUSCULAR | Status: AC
  Administered 2022-11-08: 8 mg via INTRAVENOUS
  Filled 2022-11-08: qty 1

## 2022-11-08 MED ORDER — PREDNISONE 10 MG PO TABS: 40.0000 mg | ORAL_TABLET | Freq: Every day | ORAL | 0 refills | Status: AC

## 2022-11-08 MED ORDER — IBUPROFEN 600 MG PO TABS: 600.0000 mg | ORAL_TABLET | Freq: Three times a day (TID) | ORAL | 0 refills | Status: DC | PRN

## 2022-11-08 MED ORDER — OXYCODONE-ACETAMINOPHEN 5-325 MG PO TABS
1.0000 | ORAL_TABLET | Freq: Once | ORAL | Status: AC
  Administered 2022-11-08: 1 via ORAL
  Filled 2022-11-08: qty 1

## 2022-11-08 NOTE — ED Provider Notes (Signed)
[facility] EMERGENCY DEPARTMENT Provider Note   CSN: [id number] Arrival date & time: 11/08/22  1231     History  Chief Complaint  Patient presents with   Arm Pain   Numbness    Stephen Garcia is a 81 y.o. male presenting from urgent care with left arm numbness and pain ongoing for about 3 days.  He reports gradual onset 3 days ago.  Denied falls or injuries.  Says he has never had this feeling before.  He says there is a sharp pain that seems to be shooting down from his shoulder into his hand.  There are some paresthesias in his hand.  It does not appear to be positional.  He denies any chest discomfort, shortness of breath, lightheadedness, diaphoresis.  He was seen at urgent care today and was sent to the ED for further workup including cardiac evaluation.  He denies history of MI, smoking, diabetes, high cholesterol, hypertension, or significant family history of MI.  He is accompanied by his wife here.  Denies history of DVT or PE.  HPI     Home Medications Prior to Admission medications   Medication Sig Start Date End Date Taking? Authorizing Provider  ibuprofen (ADVIL) 600 MG tablet Take 1 tablet (600 mg total) by mouth every 8 (eight) hours as needed for up to 30 doses for mild pain or moderate pain. 11/08/22  Yes Danell Verno, Kermit Balo, MD  oxyCODONE-acetaminophen (PERCOCET/ROXICET) 5-325 MG tablet Take 1 tablet by mouth every 8 (eight) hours as needed for up to 10 doses for severe pain. 11/08/22  Yes Marcea Rojek, Kermit Balo, MD  predniSONE (DELTASONE) 10 MG tablet Take 4 tablets (40 mg total) by mouth daily with breakfast for 5 days. 11/09/22 11/14/22 Yes Terald Sleeper, MD  atorvastatin (LIPITOR) 20 MG tablet  09/07/15   [provider]  brimonidine (ALPHAGAN P) 0.1 % SOLN Place 1 drop into both eyes 2 times daily. 03/17/17   [provider]  COVID-19 mRNA bivalent vaccine, Pfizer, (PFIZER COVID-19 VAC BIVALENT) injection Inject into the  muscle. Patient not taking: Reported on 03/21/2022 10/08/21   Judyann Munson, MD  dorzolamide (TRUSOPT) 2 % ophthalmic solution Place 1 drop into both eyes 3 (three) times daily.    [provider]  ergocalciferol (VITAMIN D2) 50000 UNITS capsule Take 50,000 Units by mouth once a week.    [provider]  gabapentin (NEURONTIN) 300 MG capsule Take 1 capsule (300 mg total) by mouth at bedtime. 03/21/22   Drema Dallas, DO  levothyroxine (SYNTHROID) 112 MCG tablet Take 1 tablet (112 mcg total) by mouth daily. 10/15/22   Reather Littler, MD  LUMIGAN 0.01 % SOLN  06/11/17   [provider]  pilocarpine (PILOCAR) 1 % ophthalmic solution INSTILL 1 DROP INTO LEFT EYE 4 TIMES DAILY    [provider]  prednisoLONE acetate (PRED FORTE) 1 % ophthalmic suspension 1 drop 4 (four) times daily.    [provider]  ROCKLATAN 0.02-0.005 % SOLN SMARTSIG:1 In Eye(s) Daily 06/18/22   [provider]      Allergies    Patient has no known allergies.    Review of Systems   Review of Systems  Physical Exam Updated Vital Signs BP (!) 151/71 (BP Location: Right Arm)   Pulse (!) 51   Temp 98.1 F (36.7 C) (Oral)   Resp 16   Ht 5\' 5"  (1.651 m)   Wt 82.6 kg   SpO2 99%   BMI  30.29 kg/m  Physical Exam Constitutional:      General: He is not in acute distress. HENT:     Head: Normocephalic and atraumatic.  Eyes:     Conjunctiva/sclera: Conjunctivae normal.     Pupils: Pupils are equal, round, and reactive to light.  Cardiovascular:     Rate and Rhythm: Normal rate and regular rhythm.  Pulmonary:     Effort: Pulmonary effort is normal. No respiratory distress.  Abdominal:     General: There is no distension.     Tenderness: There is no abdominal tenderness.  Musculoskeletal:     Comments: Trigger point tenderness along the supraspinatus region of the left shoulder  Skin:    General: Skin is warm and dry.  Neurological:     General: No focal deficit  present.     Mental Status: He is alert and oriented to person, place, and time. Mental status is at baseline.  Psychiatric:        Mood and Affect: Mood normal.        Behavior: Behavior normal.     ED Results / Procedures / Treatments   Labs (all labs ordered are listed, but only abnormal results are displayed) Labs Reviewed  BASIC METABOLIC PANEL  CBC  TROPONIN I (HIGH SENSITIVITY)  TROPONIN I (HIGH SENSITIVITY)    EKG EKG Interpretation  Date/Time:  Friday November 08 2022 13:02:39 EST Ventricular Rate:  49 PR Interval:  142 QRS Duration: 94 QT Interval:  410 QTC Calculation: 370 R Axis:   -12 Text Interpretation: Sinus bradycardia Moderate voltage criteria for LVH, may be normal variant ( R in aVL , Cornell product ) Borderline ECG When compared with ECG of 08-Nov-2022 11:20, PREVIOUS ECG IS PRESENT No significant changes Confirmed by Alvester Chou [phone]) on 11/08/2022 3:45:26 PM  Radiology DG Chest 2 View  Result Date: 11/08/2022 CLINICAL DATA:  An 81 year old male presents for evaluation of LEFT arm pain and numbness. EXAM: CHEST - 2 VIEW COMPARISON:  July of 2018. FINDINGS: Cardiomediastinal contours and hilar structures are normal. No consolidation or pleural effusion.  No pneumothorax. On limited assessment no acute skeletal process. IMPRESSION: No active cardiopulmonary disease. Electronically Signed   By: Donzetta Kohut M.D.   On: 11/08/2022 13:44    Procedures Procedures    Medications Ordered in ED Medications  dexamethasone (DECADRON) injection 8 mg (8 mg Intravenous Given 11/08/22 1622)  ketorolac (TORADOL) 30 MG/ML injection 30 mg (30 mg Intravenous Given 11/08/22 1622)  oxyCODONE-acetaminophen (PERCOCET/ROXICET) 5-325 MG per tablet 1 tablet (1 tablet Oral Given 11/08/22 1620)    ED Course/ Medical Decision Making/ A&P                           Medical Decision Making Risk Prescription drug management.   This patient presents to the ED with  concern for left arm pain. This involves an extensive number of treatment options, and is a complaint that carries with it a high risk of complications and morbidity.  The differential diagnosis includes radiculopathy versus nerve pain versus muscle injury versus other  I have considered atypical ACS, but have a much lower suspicion at this time.  Overall he is a low risk patient for cardiovascular disease by his heart score (of 3).  His chest pain began at rest, and is not associated with any other expected anginal symptoms, including diaphoresis or lightheadedness or shortness of breath.  His EKG is also quite benign with  no evidence of acute ischemia on it, and his troponins are undetectable.  I more so suspect that this is a cervical radiculopathy.  The patient was given steroids for this.  As well as Toradol.  And a dose of Percocet.  On reassessment he did have some improvement of his pain from a 10 out of 10 to about 5 out of 10.  Lower suspicion for DVT.  There are no risk factors for DVT and no unilateral arm swelling.  Additional history obtained from the patient's wife  I ordered and personally interpreted labs.  The pertinent results include: Labs are unremarkable  I ordered imaging studies including ultrasound, x-ray of the chest I independently visualized and interpreted imaging which showed no acute abnormalities and chest x-ray I agree with the radiologist interpretation  The patient was maintained on a cardiac monitor.  I personally viewed and interpreted the cardiac monitored which showed an underlying rhythm of: Normal rhythm  Per my interpretation the patient's ECG shows sinus bradycardia, which appears to be his baseline heart rate, without acute ischemic findings  Test Considered: I will lower suspicion for aortic dissection or acute pulmonary embolism and do not feel CT angiogram is indicated at this time.  I have a low suspicion for stroke and do not feel that  neurovascular imaging was indicated at this time.  After the interventions noted above, I reevaluated the patient and found that they have: improved significantly.   Dispostion:  After consideration of the diagnostic results and the patients response to treatment, I feel that the patent would benefit from outpatient PCP follow-up.         Final Clinical Impression(s) / ED Diagnoses Final diagnoses:  Radiculopathy, unspecified spinal region  Left arm pain    Rx / DC Orders ED Discharge Orders          Ordered    predniSONE (DELTASONE) 10 MG tablet  Daily with breakfast        11/08/22 1913    oxyCODONE-acetaminophen (PERCOCET/ROXICET) 5-325 MG tablet  Every 8 hours PRN        11/08/22 1913    ibuprofen (ADVIL) 600 MG tablet  Every 8 hours PRN        11/08/22 1913              Terald Sleeper, MD 11/08/22 1932
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
## 2022-11-08 NOTE — Discharge Instructions (Addendum)
It is possible that your arm pain is coming as a result of the pinched nerve in your neck.  I prescribed you steroids which may help with this pain, as well as Percocet which is a strong narcotic pain medicine.  This is only a short-term course of pain medicine.  You will need to follow-up with your doctors office for your persistent pain.  If the pain is getting worse, or you lose feeling in your arm, begin having weakness in this arm, or begin having chest pain or difficulty breathing or loss of consciousness, please return to the ER immediately.

## 2022-11-08 NOTE — ED Notes (Signed)
Patient is being discharged from the Urgent Care and sent to the Emergency Department via ems . Per mound np, patient is in need of higher level of care due to mi changes on ekg. Patient is aware and verbalizes understanding of plan of care.  Vitals:   11/08/22 1102  BP: (!) 153/79  Pulse: (!) 55  Resp: 16  Temp: (!) 97.5 F (36.4 C)  SpO2: 98%

## 2022-11-08 NOTE — ED Provider Triage Note (Signed)
Emergency Medicine Provider Triage Evaluation Note  Stephen Garcia , a 81 y.o. male  was evaluated in triage.  Pt complains of left arm pain. He states that same has been ongoing for the past 2-3 days but is worse today. He denies any history of similar pain previously. Denies any recent overuse. Denies any chest pain or shortness of breath. States that he feels like his left hand is numb. Denies cardiac history. Denies nausea, vomiting. Sent from urgent care with concerns for cardiac etiology.  Review of Systems  Positive:  Negative:   Physical Exam  BP (!) 159/76   Pulse (!) 51   Temp 98.4 F (36.9 C) (Oral)   Resp 14   Ht 5\' 5"  (1.651 m)   Wt 82.6 kg   SpO2 98%   BMI 30.29 kg/m  Gen:   Awake, no distress   Resp:  Normal effort  MSK:   Moves extremities without difficulty  Other:  Tenderness to palpation of the left anterior upper arm. ROM intact. Radial and ulnar pulses intact and 2+. 5/5 strength and sensation intact.  Medical Decision Making  Medically screening exam initiated at 1:07 PM.  Appropriate orders placed.  was informed that the remainder of the evaluation will be completed by another provider, this initial triage assessment does not replace that evaluation, and the importance of remaining in the ED until their evaluation is complete.     [name], PA-C 11/08/22 1311

## 2022-11-08 NOTE — ED Triage Notes (Addendum)
Pt presents to uc with co of left arm numbness for 3-4 days. Pt has been taking tylenol and advill for symptoms.  Pt reports mild cp in mid chest after asked about it

## 2022-11-08 NOTE — Discharge Instructions (Signed)
Patient sent to hospital via EMS.  

## 2022-11-08 NOTE — ED Triage Notes (Signed)
Pt BIB [facility] from UC c/o left arm pain and numbness that started about 3 days go. UC was worried about a STEMI. Pt denies any CP. Pt is brady in the 50's at baseline.

## 2022-11-12 DIAGNOSIS — M5412 Radiculopathy, cervical region: Secondary | ICD-10-CM | POA: Diagnosis not present

## 2022-11-29 DIAGNOSIS — M5412 Radiculopathy, cervical region: Secondary | ICD-10-CM | POA: Diagnosis not present

## 2022-12-03 DIAGNOSIS — M5412 Radiculopathy, cervical region: Secondary | ICD-10-CM | POA: Diagnosis not present

## 2022-12-04 ENCOUNTER — Other Ambulatory Visit: Payer: Self-pay | Admitting: Orthopaedic Surgery

## 2022-12-04 DIAGNOSIS — H402213 Chronic angle-closure glaucoma, right eye, severe stage: Secondary | ICD-10-CM | POA: Diagnosis not present

## 2022-12-04 DIAGNOSIS — H402223 Chronic angle-closure glaucoma, left eye, severe stage: Secondary | ICD-10-CM | POA: Diagnosis not present

## 2022-12-04 DIAGNOSIS — M5412 Radiculopathy, cervical region: Secondary | ICD-10-CM

## 2022-12-09 ENCOUNTER — Encounter: Payer: Self-pay | Admitting: Orthopaedic Surgery

## 2022-12-11 ENCOUNTER — Ambulatory Visit
Admission: RE | Admit: 2022-12-11 | Discharge: 2022-12-11 | Disposition: A | Discharge status: Home / Self Care | Payer: Medicare Other | Source: Ambulatory Visit | Attending: Orthopaedic Surgery | Admitting: Orthopaedic Surgery

## 2022-12-11 DIAGNOSIS — M5412 Radiculopathy, cervical region: Secondary | ICD-10-CM

## 2022-12-11 DIAGNOSIS — M4802 Spinal stenosis, cervical region: Secondary | ICD-10-CM | POA: Diagnosis not present

## 2022-12-17 ENCOUNTER — Encounter (HOSPITAL_COMMUNITY): Payer: Self-pay

## 2022-12-17 ENCOUNTER — Emergency Department (HOSPITAL_COMMUNITY)
Admission: EM | Admit: 2022-12-17 | Discharge: 2022-12-17 | Disposition: A | Discharge status: Home / Self Care | Payer: Medicare Other | Attending: Emergency Medicine | Admitting: Emergency Medicine

## 2022-12-17 ENCOUNTER — Other Ambulatory Visit: Payer: Self-pay

## 2022-12-17 ENCOUNTER — Emergency Department (HOSPITAL_COMMUNITY): Payer: Medicare Other

## 2022-12-17 DIAGNOSIS — N209 Urinary calculus, unspecified: Secondary | ICD-10-CM | POA: Diagnosis not present

## 2022-12-17 DIAGNOSIS — Z041 Encounter for examination and observation following transport accident: Secondary | ICD-10-CM | POA: Diagnosis not present

## 2022-12-17 DIAGNOSIS — S0990XA Unspecified injury of head, initial encounter: Secondary | ICD-10-CM | POA: Diagnosis not present

## 2022-12-17 DIAGNOSIS — R001 Bradycardia, unspecified: Secondary | ICD-10-CM | POA: Diagnosis not present

## 2022-12-17 DIAGNOSIS — S3992XA Unspecified injury of lower back, initial encounter: Secondary | ICD-10-CM | POA: Diagnosis not present

## 2022-12-17 DIAGNOSIS — R0600 Dyspnea, unspecified: Secondary | ICD-10-CM | POA: Diagnosis not present

## 2022-12-17 DIAGNOSIS — R079 Chest pain, unspecified: Secondary | ICD-10-CM | POA: Insufficient documentation

## 2022-12-17 DIAGNOSIS — Z743 Need for continuous supervision: Secondary | ICD-10-CM | POA: Diagnosis not present

## 2022-12-17 DIAGNOSIS — R0789 Other chest pain: Secondary | ICD-10-CM | POA: Diagnosis not present

## 2022-12-17 DIAGNOSIS — Y92410 Unspecified street and highway as the place of occurrence of the external cause: Secondary | ICD-10-CM | POA: Diagnosis not present

## 2022-12-17 DIAGNOSIS — Z79899 Other long term (current) drug therapy: Secondary | ICD-10-CM | POA: Insufficient documentation

## 2022-12-17 DIAGNOSIS — N210 Calculus in bladder: Secondary | ICD-10-CM

## 2022-12-17 DIAGNOSIS — S32030A Wedge compression fracture of third lumbar vertebra, initial encounter for closed fracture: Secondary | ICD-10-CM

## 2022-12-17 DIAGNOSIS — M545 Low back pain, unspecified: Secondary | ICD-10-CM | POA: Diagnosis not present

## 2022-12-17 DIAGNOSIS — S199XXA Unspecified injury of neck, initial encounter: Secondary | ICD-10-CM | POA: Diagnosis not present

## 2022-12-17 DIAGNOSIS — S99911A Unspecified injury of right ankle, initial encounter: Secondary | ICD-10-CM | POA: Diagnosis not present

## 2022-12-17 DIAGNOSIS — I6782 Cerebral ischemia: Secondary | ICD-10-CM | POA: Diagnosis not present

## 2022-12-17 DIAGNOSIS — S299XXA Unspecified injury of thorax, initial encounter: Secondary | ICD-10-CM | POA: Diagnosis not present

## 2022-12-17 LAB — PROTIME-INR
INR: 1 (ref 0.8–1.2)
Prothrombin Time: 12.8 seconds (ref 11.4–15.2)

## 2022-12-17 LAB — COMPREHENSIVE METABOLIC PANEL
ALT: 18 U/L (ref 0–44)
AST: 22 U/L (ref 15–41)
Albumin: 3.8 g/dL (ref 3.5–5.0)
Alkaline Phosphatase: 51 U/L (ref 38–126)
Anion gap: 10 (ref 5–15)
BUN: 7 mg/dL — ABNORMAL LOW (ref 8–23)
CO2: 23 mmol/L (ref 22–32)
Calcium: 9.9 mg/dL (ref 8.9–10.3)
Chloride: 102 mmol/L (ref 98–111)
Creatinine, Ser: 1.02 mg/dL (ref 0.61–1.24)
GFR, Estimated: >60 mL/min (ref >60)
Glucose, Bld: 102 mg/dL — ABNORMAL HIGH (ref 70–99)
Potassium: 4.2 mmol/L (ref 3.5–5.1)
Sodium: 135 mmol/L (ref 135–145)
Total Bilirubin: 0.8 mg/dL (ref 0.3–1.2)
Total Protein: 7.2 g/dL (ref 6.5–8.1)

## 2022-12-17 LAB — CBC
HCT: 43.5 % (ref 39.0–52.0)
Hemoglobin: 15 g/dL (ref 13.0–17.0)
MCH: 30.3 pg (ref 26.0–34.0)
MCHC: 34.5 g/dL (ref 30.0–36.0)
MCV: 87.9 fL (ref 80.0–100.0)
Platelets: 190 10*3/uL (ref 150–400)
RBC: 4.95 MIL/uL (ref 4.22–5.81)
RDW: 13 % (ref 11.5–15.5)
WBC: 5.8 10*3/uL (ref 4.0–10.5)
nRBC: 0 % (ref 0.0–0.2)

## 2022-12-17 LAB — TROPONIN I (HIGH SENSITIVITY)
Troponin I (High Sensitivity): 5 ng/L (ref <18)
Troponin I (High Sensitivity): 5 ng/L (ref <18)

## 2022-12-17 LAB — ETHANOL: Alcohol, Ethyl (B): <10 mg/dL (ref <10)

## 2022-12-17 LAB — URINALYSIS, ROUTINE W REFLEX MICROSCOPIC
Bilirubin Urine: NEGATIVE
Glucose, UA: NEGATIVE mg/dL
Hgb urine dipstick: NEGATIVE
Ketones, ur: NEGATIVE mg/dL
Leukocytes,Ua: NEGATIVE
Nitrite: NEGATIVE
Protein, ur: NEGATIVE mg/dL
Specific Gravity, Urine: 1.018 (ref 1.005–1.030)
pH: 6 (ref 5.0–8.0)

## 2022-12-17 LAB — LACTIC ACID, PLASMA: Lactic Acid, Venous: 1.3 mmol/L (ref 0.5–1.9)

## 2022-12-17 LAB — MAGNESIUM: Magnesium: 2.3 mg/dL (ref 1.7–2.4)

## 2022-12-17 MED ORDER — OXYCODONE-ACETAMINOPHEN 5-325 MG PO TABS
1.0000 | ORAL_TABLET | Freq: Once | ORAL | Status: AC
  Administered 2022-12-17: 1 via ORAL
  Filled 2022-12-17: qty 1

## 2022-12-17 MED ORDER — IOHEXOL 350 MG/ML SOLN
75.0000 mL | Freq: Once | INTRAVENOUS | Status: AC | PRN
  Administered 2022-12-17: 75 mL via INTRAVENOUS

## 2022-12-17 MED ORDER — FENTANYL CITRATE PF 50 MCG/ML IJ SOSY
12.5000 ug | PREFILLED_SYRINGE | Freq: Once | INTRAMUSCULAR | Status: AC
  Administered 2022-12-17: 12.5 ug via INTRAVENOUS
  Filled 2022-12-17: qty 1
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
## 2022-12-18 LAB — I-STAT CHEM 8, ED
BUN: 10 mg/dL (ref 8–23)
Calcium, Ion: 1.24 mmol/L (ref 1.15–1.40)
Chloride: 104 mmol/L (ref 98–111)
Creatinine, Ser: 1 mg/dL (ref 0.61–1.24)
Glucose, Bld: 103 mg/dL — ABNORMAL HIGH (ref 70–99)
HCT: 44 % (ref 39.0–52.0)
Hemoglobin: 15 g/dL (ref 13.0–17.0)
Potassium: 4 mmol/L (ref 3.5–5.1)
Sodium: 138 mmol/L (ref 135–145)
TCO2: 27 mmol/L (ref 22–32)

## 2022-12-24 DIAGNOSIS — N210 Calculus in bladder: Secondary | ICD-10-CM | POA: Diagnosis not present

## 2022-12-24 DIAGNOSIS — S32030D Wedge compression fracture of third lumbar vertebra, subsequent encounter for fracture with routine healing: Secondary | ICD-10-CM | POA: Diagnosis not present

## 2022-12-24 DIAGNOSIS — R911 Solitary pulmonary nodule: Secondary | ICD-10-CM | POA: Diagnosis not present

## 2022-12-24 DIAGNOSIS — M5412 Radiculopathy, cervical region: Secondary | ICD-10-CM | POA: Diagnosis not present

## 2022-12-30 DIAGNOSIS — M5412 Radiculopathy, cervical region: Secondary | ICD-10-CM | POA: Diagnosis not present

## 2022-12-30 DIAGNOSIS — S32030D Wedge compression fracture of third lumbar vertebra, subsequent encounter for fracture with routine healing: Secondary | ICD-10-CM | POA: Diagnosis not present

## 2022-12-30 DIAGNOSIS — R55 Syncope and collapse: Secondary | ICD-10-CM | POA: Diagnosis not present

## 2022-12-31 DIAGNOSIS — M5412 Radiculopathy, cervical region: Secondary | ICD-10-CM | POA: Diagnosis not present

## 2023-01-14 ENCOUNTER — Encounter: Payer: Self-pay | Admitting: Internal Medicine

## 2023-01-14 ENCOUNTER — Ambulatory Visit: Payer: Medicare Other | Admitting: Internal Medicine

## 2023-01-14 VITALS — BP 118/60 | HR 51 | Temp 98.0°F | Ht 65.0 in | Wt 175.0 lb

## 2023-01-14 DIAGNOSIS — R058 Other specified cough: Secondary | ICD-10-CM

## 2023-01-14 DIAGNOSIS — R911 Solitary pulmonary nodule: Secondary | ICD-10-CM | POA: Diagnosis not present

## 2023-01-14 NOTE — Progress Notes (Signed)
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  eval  04/15/12  by Dr Halford Chessman with dx pnds/gerd (upper airway cough) rec maint on gerd rx and f/u in 3 m but did not return    12/11/2015 1st [facility] office visit/ Stephen Garcia  Re cough x 2012  On timolol eyedrops/ fosfamax Chief Complaint  Patient presents with   Pulmonary Consult    Referred by Dr. Moreen Fowler. Pt c/o cough "for years" worse x 3 wks. He states that he sometimes coughs until "passes out" or has "seizure like symptoms".  Pt states cough is non prod. He states cough does not bother him often "just when I have a bad cold".   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  cough is nonprod, worse after eating spicy food.  Pt's wife states pt goes into "spells" after coughing sometimes-wife states eyes roll back, starts shaking Xseveral  seconds-pt does not remember these events.    cough is dry  more day than night  / worse with colds/ some better with pepcid, could not take pantoprazole at all  rec Please see patient coordinator before you leave today  to schedule sinus CT> net   Pepcid 20 mg after bfast and after supper  For drainage / throat tickle try take CHLORPHENIRAMINE  4 mg - take one every 4 hours as needed - available over the counter- may cause drowsiness so start with just a bedtime dose or two and see how you tolerate it before trying in daytime   Prednisone 10 mg take  4 each am x 2 days,   2 each am x 2 days,  1 each am x 2 days and stop  Take enough tessalon to where you don't cough at all x 5 straight days then stop  Singulair (montelukast) 10 mg  At bedtime  Please schedule a follow up office visit in 4 weeks, sooner if needed with all medications and candy    02/12/2016  f/u ov/Stephen Garcia re: uacs with variable assoc hoarseness x 1960's worse since 2012  Chief Complaint  Patient presents with   Follow-up    Cough is unchanged. No new co's today.   prednisone may  have helped a little but only trasiently/ not clear he followed recs esp re h1 "something I put up my nose you gave me helped my nasal congestion (note we didn't rec any topicals) - still variable daytime dry cough with severe hoarseness  rec Please see patient coordinator before you leave today  to schedule ENT eval for your hoarseness> not done    For drainage / throat tickle try take CHLORPHENIRAMINE  4 mg - take one every 4 hours as needed -   Pepcid should be 20 mg after bfast and after supper since you can't take the reflux medication called pantoprazole  Finish up the montelukast and then try off to see what difference this makes - if not worse don't take it  I will let Dr Dwyane Dee know about the Fosfamax and the potential adverse effect on your throat (especially since you can't take the reflux medicines)     01/13/2019  Acute ov/Stephen Garcia re:  UACS/  flare x 2 weeks with noct sense of Wheeze "it's coming from my adenoids in my chest"  Chief Complaint  Patient presents with   Acute Visit    Pt c/o wheezing esp at night x 2 wks. He has minimal cough with yellow sputum.  Dyspnea:  MMRC2 = can't walk a nl pace on a flat grade s sob but does fine slow and flat R foot big toe slows him down Cough: improving on its own p cough syrup/ pattern of cough is severe with pre-syncope  If sitting and syncope if standing X years Sleeping: sleeping on side/back but bed is flat  SABA use: none 02: no Rec Prednisone 10 mg take  4 each am x 2 days,   2 each am x 2 days,  1 each am x 2 days and stop   Please see patient coordinator before you leave today  to schedule ENT eval for your hoarseness and nasal congestion > [place] 03/17/2019 turbinate edema/gerd rx Nasal steroids    01/14/2023 Re-establish as new pt : ov/Stephen Garcia re spn on CT's from [place] 12/17/22  Chief Complaint  Patient presents with   Pulmonary Consult    Referred by Dr Antony Contras for eval of incidental pulmonary nodule.   Dyspnea:  not limited / yardwork tol ok  Cough: dry  Sleeping:  no resp cc  SABA use: none  02: none  Covid status:   vax all except last / never infected      No obvious day to day or daytime variability or assoc excess/ purulent sputum or mucus plugs or hemoptysis or cp or chest tightness, subjective wheeze or overt sinus or hb symptoms.   Sleeping flat without nocturnal  or early am exacerbation  of respiratory  c/o's or need for noct saba. Also denies any obvious fluctuation of symptoms with weather or environmental changes or other aggravating or alleviating factors except as outlined above   No unusual exposure hx or h/o childhood pna/ asthma or knowledge of premature birth.  Current Allergies, Complete Past Medical History, Past Surgical History, Family History, and Social History were reviewed in [employer] record.  ROS  The following  are not active complaints unless bolded Hoarseness, sore throat, dysphagia, dental problems, itching, sneezing,  nasal congestion or discharge of excess mucus or purulent secretions, ear ache,   fever, chills, sweats, unintended wt loss or wt gain, classically pleuritic or exertional cp,  orthopnea pnd or arm/hand swelling  or leg swelling, presyncope, palpitations, abdominal pain,  anorexia, nausea, vomiting, diarrhea  or change in bowel habits or change in bladder habits, change in stools or change in urine, dysuria, hematuria,  rash, arthralgias, visual complaints, headache, numbness, weakness or ataxia or problems with walking or coordination,  change in mood or  memory.        Current Meds  Medication Sig   atorvastatin (LIPITOR) 20 MG tablet    brimonidine (ALPHAGAN P) 0.1 % SOLN Place 1 drop into both eyes 2 times daily.   dorzolamide (TRUSOPT) 2 % ophthalmic solution Place 1 drop into both eyes 3 (three) times daily.   ergocalciferol (VITAMIN D2) 50000 UNITS capsule Take 50,000 Units by mouth once a week.   ibuprofen (ADVIL) 600 MG tablet Take 1 tablet (600 mg total) by mouth every 8 (eight) hours as needed for up to 30 doses for mild pain or moderate pain.   levothyroxine (SYNTHROID) 100 MCG tablet Take 100 mcg by mouth daily before breakfast.   oxyCODONE-acetaminophen (PERCOCET/ROXICET) 5-325 MG tablet Take 1 tablet by mouth every 8 (eight) hours as needed for up to 10 doses for severe pain.   pilocarpine (PILOCAR) 1 % ophthalmic solution INSTILL 1 DROP INTO LEFT EYE 4 TIMES DAILY   ROCKLATAN 0.02-0.005 % SOLN SMARTSIG:1 In Eye(s) Daily                Objective:   Physical Exam    01/14/2023          175  01/13/2019          185   01/12/2016         191   12/11/15 188 lb 12.8 oz (85.639 kg)  09/18/15 194 lb 3.2 oz (88.089 kg)  09/16/14 186 lb 3.2 oz (84.46 kg)    Vital signs reviewed  01/14/2023  - Note at rest 02 sats  100% on RA   General appearance:    hoarse amb bm nad     HEENT : Oropharynx  clear          NECK :  without  apparent JVD/ palpable Nodes/TM    LUNGS: no acc muscle use,  Nl contour chest which is clear to A and P bilaterally without cough on insp or exp maneuvers   CV:  RRR  no s3 or murmur or increase in P2, and no edema   ABD:  soft and nontender with nl inspiratory excursion in the supine position. No bruits or organomegaly appreciated   MS:  Nl gait/ ext warm without deformities Or obvious joint restrictions  calf tenderness, cyanosis or clubbing    SKIN: warm and dry without lesions    NEURO:  alert, approp, nl sensorium with  no motor or cerebellar deficits apparent.          Assessment & Plan:

## 2023-01-14 NOTE — Patient Instructions (Addendum)
My office will be contacting you by phone for referral to CT s contrast 03/17/23   - if you don't hear back from my office within one week please call us back or notify us thru MyChart and we'll address it right away.    If throat gets worse= hoarseness or cough>>>  then see your ENT doctor [phone]

## 2023-01-15 DIAGNOSIS — R3912 Poor urinary stream: Secondary | ICD-10-CM | POA: Diagnosis not present

## 2023-01-15 DIAGNOSIS — N210 Calculus in bladder: Secondary | ICD-10-CM | POA: Diagnosis not present

## 2023-01-15 DIAGNOSIS — R3916 Straining to void: Secondary | ICD-10-CM | POA: Diagnosis not present

## 2023-01-15 NOTE — Assessment & Plan Note (Addendum)
Onset around 2012 PFT 04/15/12>>FEV1 2.29 (91%), FEV1% 81, TLC 4.59(83%), DLCO 65%, no BD. - trial off fosfamax and on gerd rx 12/11/2015 > could not tol PPI  - Allergy profile 12/11/2015 >  Eos 0.2 /  IgE 22 with Neg RAST - Sinus Ct 01/17/2016 > No evidence of sinusitis. - recurrent cough / noct wheeze  12/2018 > referred to ent 01/13/2019  - FENO 01/13/2019  =   33 (indeterminate range) - Spirometry 01/13/2019  FEV1 1.9 (94%)  Ratio 0.80 s curvature and no rx prior   Cough continues to have mostly upper airway features assoc with hoarseness that waxes and wanes   >>> referred back to [facility] group  in event the hoarseness or cough flare over baseline though note not presently on GERD rx which he should probably consider x 6 weeks with otcs so rec  Try prilosec otc 20mg   Take 30-60 min before first meal of the day and Pepcid ac (famotidine) 20 mg one @  bedtime until cough/hoarseness improve  for at least a week without the need for cough suppression

## 2023-01-16 ENCOUNTER — Other Ambulatory Visit: Payer: Self-pay | Admitting: Internal Medicine

## 2023-01-16 ENCOUNTER — Telehealth: Payer: Self-pay | Admitting: Internal Medicine

## 2023-01-16 DIAGNOSIS — H2512 Age-related nuclear cataract, left eye: Secondary | ICD-10-CM | POA: Diagnosis not present

## 2023-01-16 DIAGNOSIS — H5711 Ocular pain, right eye: Secondary | ICD-10-CM | POA: Diagnosis not present

## 2023-01-16 DIAGNOSIS — H401133 Primary open-angle glaucoma, bilateral, severe stage: Secondary | ICD-10-CM | POA: Diagnosis not present

## 2023-01-16 DIAGNOSIS — H52203 Unspecified astigmatism, bilateral: Secondary | ICD-10-CM | POA: Diagnosis not present

## 2023-01-16 DIAGNOSIS — H2000 Unspecified acute and subacute iridocyclitis: Secondary | ICD-10-CM | POA: Diagnosis not present

## 2023-01-16 NOTE — Telephone Encounter (Signed)
Per Dr Melvyn Novas- CT ordered with instructions to be done 03/17/23 and not prior. It was scheduled for 02/10/23, though. Please change appt and let the pt know new date. Thanks!

## 2023-01-16 NOTE — Telephone Encounter (Signed)
This fell into a wq at [facility] that's why it was scheduled to early I have it to move
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
## 2023-01-17 ENCOUNTER — Ambulatory Visit: Payer: Medicare Other | Admitting: Neurology

## 2023-01-17 ENCOUNTER — Encounter: Payer: Self-pay | Admitting: Neurology

## 2023-01-17 VITALS — BP 127/72 | HR 73 | Ht 64.0 in | Wt 175.2 lb

## 2023-01-17 DIAGNOSIS — R4020 Unspecified coma: Secondary | ICD-10-CM | POA: Diagnosis not present

## 2023-01-17 NOTE — Telephone Encounter (Signed)
The recommendation comes from the guidelines that his insurance company will be using to pay for it so there's a risk if  we don't follow the guidelines that he will have to pay for it himself.  If he's having new resp symptoms we might could justify doing it sooner, but then we'd need to see him first to be sure we had the right test ordered for new symptoms

## 2023-01-17 NOTE — Telephone Encounter (Signed)
Attempted to call pt but unable to reach. Left message to return call.  

## 2023-01-17 NOTE — Telephone Encounter (Signed)
Dr Melvyn Novas  Patient is wanting CT scan done before May 2024.   Are you wanting him to keep his scan in May or can he get one done sooner?  Please advise sir
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
## 2023-01-20 NOTE — Telephone Encounter (Signed)
Called and spoke with patient and his wife Stephen Garcia. I explained to them the reason why the CT scan can not be done sooner. They both verbalized understanding. I also advised them that it appears the CT scan had already been scheduled for 03/17/23 at 10am. They had written this information down but did not have the address. I provided them with the address and they both verbalized understanding.   Nothing further needed at time of call.

## 2023-01-20 NOTE — Telephone Encounter (Signed)
Stanton Kidney wife is returning phone call. Mary phone number is [phone] and [phone].

## 2023-01-22 ENCOUNTER — Encounter: Payer: Self-pay | Admitting: Internal Medicine

## 2023-01-22 NOTE — Assessment & Plan Note (Signed)
Never smoker -  has pleural based left lower lobe nodule first detected in 2011. This has been stable on radiographic follow up through January 2013.  - CT chest 12/17/22  1.4 cm left lower lobe subpleural pulmonary nodule versus area of rounded atelectasis.  Area of wispy increased attenuation in the periphery of the right lobe of the liver which could represent contusion. - repeat CT chest due 03/17/23   Most likely rounded atx in never smoker but agree with radiology best to do f/u CT in 3 m and consider PET to put this issue to rest given the size.   Discussed in detail all the  indications, usual  risks and alternatives  relative to the benefits with patient who agrees to proceed with w/u as outlined.            Each maintenance medication was reviewed in detail including emphasizing most importantly the difference between maintenance and prns and under what circumstances the prns are to be triggered using an action plan format where appropriate.  Total time for H and P, chart review, counseling,  and generating customized AVS unique to this office visit / same day charting > 30 min in pt not seen in over 3 y

## 2023-01-24 DIAGNOSIS — H5340 Unspecified visual field defects: Secondary | ICD-10-CM | POA: Diagnosis not present

## 2023-01-24 DIAGNOSIS — H401133 Primary open-angle glaucoma, bilateral, severe stage: Secondary | ICD-10-CM | POA: Diagnosis not present

## 2023-01-27 DIAGNOSIS — R3916 Straining to void: Secondary | ICD-10-CM | POA: Diagnosis not present

## 2023-01-27 DIAGNOSIS — R3912 Poor urinary stream: Secondary | ICD-10-CM | POA: Diagnosis not present

## 2023-01-27 DIAGNOSIS — R350 Frequency of micturition: Secondary | ICD-10-CM | POA: Diagnosis not present

## 2023-01-28 ENCOUNTER — Encounter: Payer: Self-pay | Admitting: Neurology

## 2023-01-28 ENCOUNTER — Telehealth: Payer: Self-pay | Admitting: Anesthesiology

## 2023-01-28 ENCOUNTER — Other Ambulatory Visit: Payer: Medicare Other

## 2023-01-28 DIAGNOSIS — Z029 Encounter for administrative examinations, unspecified: Secondary | ICD-10-CM

## 2023-01-28 NOTE — Telephone Encounter (Signed)
Pt's wife left message with AN stating pt has 2 appts coming up one for an X-ray and an EEG. States has questions about appointments.

## 2023-01-29 ENCOUNTER — Ambulatory Visit: Payer: Medicare Other | Admitting: Neurology

## 2023-01-29 DIAGNOSIS — R4020 Unspecified coma: Secondary | ICD-10-CM | POA: Diagnosis not present

## 2023-02-06 DIAGNOSIS — H402233 Chronic angle-closure glaucoma, bilateral, severe stage: Secondary | ICD-10-CM | POA: Diagnosis not present

## 2023-02-08 ENCOUNTER — Ambulatory Visit
Admission: RE | Admit: 2023-02-08 | Discharge: 2023-02-08 | Disposition: A | Discharge status: Home / Self Care | Payer: Medicare Other | Source: Ambulatory Visit | Attending: Neurology | Admitting: Neurology

## 2023-02-08 DIAGNOSIS — R4020 Unspecified coma: Secondary | ICD-10-CM

## 2023-02-08 DIAGNOSIS — R55 Syncope and collapse: Secondary | ICD-10-CM | POA: Diagnosis not present

## 2023-02-08 MED ORDER — GADOPICLENOL 0.5 MMOL/ML IV SOLN
8.0000 mL | Freq: Once | INTRAVENOUS | Status: AC | PRN
  Administered 2023-02-08: 8 mL via INTRAVENOUS

## 2023-02-10 ENCOUNTER — Other Ambulatory Visit: Payer: Medicare Other

## 2023-02-11 ENCOUNTER — Other Ambulatory Visit (INDEPENDENT_AMBULATORY_CARE_PROVIDER_SITE_OTHER): Payer: Medicare Other

## 2023-02-11 DIAGNOSIS — E039 Hypothyroidism, unspecified: Secondary | ICD-10-CM | POA: Diagnosis not present

## 2023-02-11 LAB — TSH: TSH: 0.14 u[IU]/mL — ABNORMAL LOW (ref 0.35–5.50)

## 2023-02-11 LAB — T4, FREE: Free T4: 1 ng/dL (ref 0.60–1.60)

## 2023-02-13 ENCOUNTER — Ambulatory Visit: Payer: Medicare Other | Admitting: Endocrinology

## 2023-02-13 ENCOUNTER — Encounter: Payer: Self-pay | Admitting: Endocrinology

## 2023-02-13 VITALS — BP 122/70 | HR 64 | Ht 64.0 in | Wt 177.0 lb

## 2023-02-13 DIAGNOSIS — E559 Vitamin D deficiency, unspecified: Secondary | ICD-10-CM | POA: Diagnosis not present

## 2023-02-13 DIAGNOSIS — R7301 Impaired fasting glucose: Secondary | ICD-10-CM

## 2023-02-13 DIAGNOSIS — E039 Hypothyroidism, unspecified: Secondary | ICD-10-CM | POA: Diagnosis not present

## 2023-02-13 MED ORDER — LEVOTHYROXINE SODIUM 112 MCG PO TABS: 112.0000 ug | ORAL_TABLET | Freq: Every day | ORAL | 3 refills | Status: AC

## 2023-02-13 NOTE — Progress Notes (Signed)
Patient ID: Stephen Garcia, male   DOB: 15-Feb-1941, 82 y.o.   MRN: [id number]   Reason for Appointment:  followup visit     History of Present Illness:    OSTEOPOROSIS: The following is a copy of the previous note He has known vertebral compression fracture of L2 on his MRI which was diagnosed on x-rays for nonspecific back pain Initial lumbar compression fractures of L1, L2 were noticed in 2011 on a routine x-ray   Also has had lumbar disc disease and has had chronic low back pain He does not think his back pain is any worse and has not had any episodes of low back pain  He has a bone density done at baseline indicating osteoporosis.  T score was reportedly -3.0 in 2014  Because of his osteoporosis and history of fractures he previously had been on Fosamax weekly since 2014  He was told by the pulmonologist to stop this because of potential for creating reflux.  He has been given an infusion of Reclast on 07/31/2016, 09/01/17, 08/2019 and 08/2021 No side effects when he gets the infusions  He has had chronic low back pain from osteoarthritis and has difficulty with other joint pains continuing   Last bone density: Results from 06/26/17 showed T score at lumbar spine -1.7 Left femoral neck -2.4  Vitamin D supplementation: He is taking 50,000 units as prescribed every Sunday His vitamin D level is consistently normal as below  Lab Results  Component Value Date   VD25OH 37.24 06/20/2022   VD25OH 46.92 05/15/2021   VD25OH 54.78 08/14/2020   VD25OH 42.27 05/17/2019    HYPOTHYROIDISM, probably SECONDARY: This was first diagnosed  several years ago Unclear what his initial symptoms at diagnosis were Previously  had been taking 75 g levothyroxine daily from his PCP When his free T4 was low in 10/21 and dose was increased to= 100 mcg  Subsequently with suppressed TSH levels in 2022 was supposed to go back to 75 mg  Currently taking 100 mcg, 8 tablets a week or the  equivalent of about 114 mcg daily Prior to increasing the dose he was having more tiredness and fatigability He had taken the levothyroxine regularly before breakfast consistently Although he was supposed to switch to the 112 mcg prescription he did not do so as he forgot  Recently again he feels fairly good with his energy level Free T4 is relatively better at 1.0, was as low as 0.58 prior to dosage increase  TSH is low as expected for his secondary hypothyroidism   Lab Results  Component Value Date   TSH 0.14 (L) 02/11/2023   TSH 0.10 (L) 10/10/2022   TSH 0.50 06/20/2022   FREET4 1.00 02/11/2023   FREET4 0.83 10/10/2022   FREET4 0.58 (L) 06/20/2022       Allergies as of 02/13/2023   No Known Allergies      Medication List        Accurate as of February 13, 2023 10:35 AM. If you have any questions, ask your nurse or doctor.          atorvastatin 20 MG tablet Commonly known as: LIPITOR   brimonidine 0.1 % Soln Commonly known as: ALPHAGAN P Place 1 drop into both eyes 2 times daily.   dorzolamide 2 % ophthalmic solution Commonly known as: TRUSOPT Place 1 drop into both eyes 3 (three) times daily.   ergocalciferol 1.25 MG (50000 UT) capsule Commonly known as: VITAMIN D2 Take 50,000  Units by mouth once a week.   ibuprofen 600 MG tablet Commonly known as: ADVIL Take 1 tablet (600 mg total) by mouth every 8 (eight) hours as needed for up to 30 doses for mild pain or moderate pain.   levothyroxine 100 MCG tablet Commonly known as: SYNTHROID Take 100 mcg by mouth daily before breakfast.   pilocarpine 1 % ophthalmic solution Commonly known as: PILOCAR INSTILL 1 DROP INTO LEFT EYE 4 TIMES DAILY   Rocklatan 0.02-0.005 % Soln Generic drug: Netarsudil-Latanoprost SMARTSIG:1 In Eye(s) Daily        Allergies: No Known Allergies  Past Medical History:  Diagnosis Date   Glaucoma    Hyperlipidemia    Hypothyroidism     Past Surgical History:  Procedure  Laterality Date   BACK SURGERY     TONSILLECTOMY      Family History  Problem Relation Age of Onset   Heart disease Mother    Dementia Sister    Asthma Sister    Dementia Sister     Social History:  reports that he has never smoked. He has never used smokeless tobacco. He reports current alcohol use. He reports that he does not use drugs.  REVIEW Of SYSTEMS:    IMPAIRED fasting glucose:  His fasting glucose was last 95 fasting and has been as high as 108  A1c in prediabetic range done by, no recent results available, previous result was 6.1    No results found for: "HGBA1C" Lab Results  Component Value Date   CREATININE 1.00 12/17/2022     Wt Readings from Last 3 Encounters:  02/13/23 177 lb (80.3 kg)  01/17/23 175 lb 3.2 oz (79.5 kg)  01/14/23 175 lb (79.4 kg)   Has chronic  hoarseness from reflux    Examination:   BP 122/70   Pulse 64   Ht 5\' 4"  (1.626 m)   Wt 177 lb (80.3 kg)   SpO2 98%   BMI 30.38 kg/m       Assessment/Plan:      Hypothyroidism, 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   Elayne Snare 02/13/2023, 10:35 AM

## 2023-02-13 NOTE — Patient Instructions (Signed)
112ug dose will be just 1 pill daily

## 2023-02-14 ENCOUNTER — Ambulatory Visit: Payer: Medicare Other | Admitting: Endocrinology

## 2023-02-17 ENCOUNTER — Telehealth: Payer: Self-pay

## 2023-02-17 NOTE — Telephone Encounter (Addendum)
Patient is going to come by to drop off forms from the [facility], wondering if we can print off the MRI results while he is here as he hasn't received them through the mail.

## 2023-02-17 NOTE — Telephone Encounter (Signed)
Patient advised Dr.Jaffe is out of the office until next week. Okay to drop off the forms but wanted make sure the patient is aware they will not be filled out this week.   MRI results printed and at the front desk.

## 2023-02-25 ENCOUNTER — Ambulatory Visit
Admission: RE | Admit: 2023-02-25 | Discharge: 2023-02-25 | Disposition: A | Discharge status: Home / Self Care | Payer: Medicare Other | Source: Ambulatory Visit | Attending: Internal Medicine | Admitting: Internal Medicine

## 2023-02-25 DIAGNOSIS — J439 Emphysema, unspecified: Secondary | ICD-10-CM | POA: Diagnosis not present

## 2023-02-25 DIAGNOSIS — R911 Solitary pulmonary nodule: Secondary | ICD-10-CM

## 2023-02-25 DIAGNOSIS — I700 Atherosclerosis of aorta: Secondary | ICD-10-CM | POA: Diagnosis not present

## 2023-02-27 NOTE — Telephone Encounter (Signed)
Patient is returning a call. °

## 2023-02-28 DIAGNOSIS — Z0279 Encounter for issue of other medical certificate: Secondary | ICD-10-CM
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
## 2023-03-04 DIAGNOSIS — N210 Calculus in bladder: Secondary | ICD-10-CM | POA: Diagnosis not present

## 2023-03-04 DIAGNOSIS — R3912 Poor urinary stream: Secondary | ICD-10-CM | POA: Diagnosis not present

## 2023-03-04 DIAGNOSIS — R350 Frequency of micturition: Secondary | ICD-10-CM | POA: Diagnosis not present

## 2023-03-05 ENCOUNTER — Other Ambulatory Visit: Payer: Self-pay | Admitting: Urology

## 2023-03-05 NOTE — Progress Notes (Signed)
Spoke with pt's spouse and notified of results/recs . She verbalized understanding and denied any questions.

## 2023-03-12 NOTE — Progress Notes (Unsigned)
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  eval  04/15/12  by Dr Halford Chessman with dx pnds/gerd (upper airway cough) rec maint on gerd rx and f/u in 3 m but did not return    12/11/2015 1st [facility] office visit/ Stephen Garcia  Re cough x 2012  On timolol eyedrops/ fosfamax Chief Complaint  Patient presents with   Pulmonary Consult    Referred by Dr. Moreen Fowler. Pt c/o cough "for years" worse x 3 wks. He states that he sometimes coughs until "passes out" or has "seizure like symptoms".  Pt states cough is non prod. He states cough does not bother him often "just when I have a bad cold".   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  cough is nonprod, worse after eating spicy food.  Pt's wife states pt goes into "spells" after coughing sometimes-wife states eyes roll back, starts shaking Xseveral  seconds-pt does not remember these events.    cough is dry  more day than night  / worse with colds/ some better with pepcid, could not take pantoprazole at all  rec Please see patient coordinator before you leave today  to schedule sinus CT> net   Pepcid 20 mg after bfast and after supper  For drainage / throat tickle try take CHLORPHENIRAMINE  4 mg - take one every 4 hours as needed - available over the counter- may cause drowsiness so start with just a bedtime dose or two and see how you tolerate it before trying in daytime   Prednisone 10 mg take  4 each am x 2 days,   2 each am x 2 days,  1 each am x 2 days and stop  Take enough tessalon to where you don't cough at all x 5 straight days then stop  Singulair (montelukast) 10 mg  At bedtime  Please schedule a follow up office visit in 4 weeks, sooner if needed with all medications and candy    02/12/2016  f/u ov/Stephen Garcia re: uacs with variable assoc hoarseness x 1960's worse since 2012  Chief Complaint  Patient presents with   Follow-up    Cough is unchanged. No new co's today.   prednisone may  have helped a little but only trasiently/ not clear he followed recs esp re h1 "something I put up my nose you gave me helped my nasal congestion (note we didn't rec any topicals) - still variable daytime dry cough with severe hoarseness  rec Please see patient coordinator before you leave today  to schedule ENT eval for your hoarseness> not done    For drainage / throat tickle try take CHLORPHENIRAMINE  4 mg - take one every 4 hours as needed -   Pepcid should be 20 mg after bfast and after supper since you can't take the reflux medication called pantoprazole  Finish up the montelukast and then try off to see what difference this makes - if not worse don't take it  I will let Dr Lucianne Muss know about the Fosfamax and the potential adverse effect on your throat (especially since you can't take the reflux medicines)     01/13/2019  Acute ov/Stephen Garcia re:  UACS/  flare x 2 weeks with noct sense of Wheeze "it's coming from my adenoids in my chest"  Chief Complaint  Patient presents with   Acute Visit    Pt c/o wheezing esp at night x 2 wks. He has minimal cough with yellow sputum.  Dyspnea:  MMRC2 = can't walk a nl pace on a flat grade s sob but does fine slow and flat R foot big toe slows him down Cough: improving on its own p cough syrup/ pattern of cough is severe with pre-syncope  If sitting and syncope if standing X years Sleeping: sleeping on side/back but bed is flat  SABA use: none 02: no Rec Prednisone 10 mg take  4 each am x 2 days,   2 each am x 2 days,  1 each am x 2 days and stop  Please see patient coordinator before you leave today  to schedule ENT eval for your hoarseness and nasal congestion > [facility] 03/17/2019 turbinate edema/gerd rx Nasal steroids    01/14/2023 Re-establish as new pt : ov/Stephen Garcia re spn on CT's from M[facility] 12/17/22  Chief Complaint  Patient presents with   Pulmonary Consult    Referred by Dr Tally Joe for eval of incidental pulmonary nodule.   Dyspnea:  not limited / yardwork tol ok  Cough: dry  Sleeping:  no resp cc  SABA use: none  02: none  Covid status:   vax all except last / never infected  Rec My office will be contacting you by phone for referral to CT s contrast 03/17/23    If throat gets worse= hoarseness or cough>>>  then see your ENT doctor [phone]    03/13/2023  f/u ov/Stephen Garcia re: cough x 2012 with h/o cough syncope maint on cough drops  no gerd rx  Chief Complaint  Patient presents with   Follow-up    Discuss CT.  Increased cough  Dyspnea:  still doing yardwork s sob or cp  Cough: varies/ worse when bend over  / assoc with variable hoarseness and indigestion  Sleeping: bed is flat/ one pillow  SABA use: none  02: none  Has not returned to ENT as rec for f/u of severe hoarseness    No obvious other patterns in day to day or daytime variability or assoc excess/ purulent sputum or mucus plugs  or hemoptysis or cp or chest tightness, subjective wheeze or overt sinus symptoms.   Sleeping  without nocturnal  or early am exacerbation  of respiratory  c/o's or need for  noct saba. Also denies any obvious fluctuation of symptoms with weather or environmental changes or other aggravating or alleviating factors except as outlined above   No unusual exposure hx or h/o childhood pna/ asthma or knowledge of premature birth.  Current Allergies, Complete Past Medical History, Past Surgical History, Family History, and Social History were reviewed in [employer] record.  ROS  The following are not active complaints unless bolded Hoarseness, sore throat, dysphagia, dental problems, itching, sneezing,  nasal congestion or discharge of excess mucus or purulent secretions, ear ache,   fever, chills, sweats, unintended wt loss or wt gain, classically pleuritic or exertional cp,  orthopnea pnd or arm/hand swelling  or leg swelling, presyncope, palpitations, abdominal pain, anorexia, nausea, vomiting, diarrhea  or change in bowel habits or change in bladder habits, change in stools or change in urine, dysuria, hematuria,  rash, arthralgias, visual complaints, headache, numbness, weakness or ataxia or problems with walking or coordination,  change in mood or  memory.        Current Meds  Medication Sig   atorvastatin (LIPITOR) 20 MG tablet    brimonidine (ALPHAGAN P) 0.1 % SOLN Place 1 drop into both eyes 2 times daily.   ergocalciferol (VITAMIN D2) 50000 UNITS capsule Take 50,000 Units by mouth once a week.   ibuprofen (ADVIL) 600 MG tablet Take 1 tablet (600 mg total) by mouth every 8 (eight) hours as needed for up to 30 doses for mild pain or moderate pain.   levothyroxine (SYNTHROID) 112 MCG tablet Take 1 tablet (112 mcg total) by mouth daily.   pilocarpine (PILOCAR) 1 % ophthalmic solution INSTILL 1 DROP INTO LEFT EYE 4 TIMES DAILY   ROCKLATAN 0.02-0.005 % SOLN SMARTSIG:1 In  Eye(s) Daily                    Objective:   Physical Exam  wts   03/13/2023          176  01/14/2023          175  01/13/2019          185   01/12/2016         191   12/11/15 188 lb 12.8 oz (85.639 kg)  09/18/15 194 lb 3.2 oz (88.089 kg)  09/16/14 186 lb 3.2 oz (84.46 kg)    Vital signs reviewed  03/13/2023  - Note at rest 02 sats  100% on RA   General appearance:    amb h hoarse  bm nad    HEENT : Oropharynx  clear      Nasal turbinates nl    NECK :  without  apparent JVD/ palpable Nodes/TM    LUNGS: no acc muscle use,  Nl contour chest which is clear to A and P bilaterally without cough on insp or exp maneuvers   CV:  RRR  no s3 or murmur or increase in P2, and no edema   ABD:  soft and nontender with nl inspiratory excursion in the supine position. No bruits or organomegaly appreciated   MS:  Nl gait/ ext warm without deformities Or obvious joint restrictions  calf tenderness, cyanosis or clubbing    SKIN: warm and dry without lesions    NEURO:  alert, approp, nl sensorium with  no motor or cerebellar deficits apparent.       I personally reviewed images and agree with radiology impression as follows:   Chest CT w/o contrast    02/25/23  1. 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 No additional follow-up imaging recommended. 2. No suspicious pulmonary nodule or acute pulmonary findings. 3. Small hiatal hernia.      Assessment & Plan:

## 2023-03-13 ENCOUNTER — Encounter: Payer: Self-pay | Admitting: Internal Medicine

## 2023-03-13 ENCOUNTER — Ambulatory Visit: Payer: Medicare Other | Admitting: Internal Medicine

## 2023-03-13 VITALS — BP 112/60 | HR 71 | Temp 98.3°F | Ht 64.0 in | Wt 176.0 lb

## 2023-03-13 DIAGNOSIS — R911 Solitary pulmonary nodule: Secondary | ICD-10-CM

## 2023-03-13 DIAGNOSIS — R058 Other specified cough: Secondary | ICD-10-CM

## 2023-03-13 NOTE — Assessment & Plan Note (Signed)
Never smoker -  has pleural based left lower lobe nodule first detected in 2011. This has been stable on radiographic follow up through January 2013.  - CT chest 12/17/22  1.4 cm left lower lobe subpleural pulmonary nodule versus area of rounded atelectasis.   - CT 03/07/23 1. 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 No additional follow-up imaging recommended. 2. Small hiatal hernia. 3. Aortic Atherosclerosis (ICD10-I70.0) and Emphysema (ICD10-J43.9).  Advised of findings of scarring only, very low risk so no additional imaging needed

## 2023-03-13 NOTE — Patient Instructions (Addendum)
Pepcid 20 mg  after breakfast  and supper for one month to see  if it helps any of your symptoms  - hoarseness, indigestion   GERD (REFLUX)  is an extremely common cause of respiratory symptoms just like yours , many times with no obvious heartburn at all.    It can be treated with medication, but also with lifestyle changes including elevation of the head of your bed (ideally with 6 -8inch blocks under the headboard of your bed),  Smoking cessation, avoidance of late meals, excessive alcohol, and avoid fatty foods, chocolate, peppermint, colas, red wine, and acidic juices such as orange juice.  NO MINT OR MENTHOL PRODUCTS SO NO COUGH DROPS  USE SUGARLESS CANDY INSTEAD (Jolley ranchers or Stover's or Life Savers) or even ice chips will also do - the key is to swallow to prevent all throat clearing. NO OIL BASED VITAMINS - use powdered substitutes.  Avoid fish oil when coughing.    Pulmonary follow up is as needed

## 2023-03-13 NOTE — Assessment & Plan Note (Signed)
Onset around 2012 PFT 04/15/12>>FEV1 2.29 (91%), FEV1% 81, TLC 4.59(83%), DLCO 65%, no BD. - trial off fosamax and on gerd rx 12/11/2015 > could not tol PPI  - Allergy profile 12/11/2015 >  Eos 0.2 /  IgE 22 with Neg RAST - Sinus Ct 01/17/2016 > No evidence of sinusitis. - recurrent cough / noct wheeze  12/2018 > referred to ent 01/13/2019  - FENO 01/13/2019  =   33 (intermediate range)  - Spirometry 01/13/2019  FEV1 1.9 (94%)  Ratio 0.80 s curvature and no rx prior   No evidence of asthma at this point and his severe hoarseness has not been re-eval by ent so rec max rx for gerd and f/u ent/ pulmonary f/u is prn          Each maintenance medication was reviewed in detail including emphasizing most importantly the difference between maintenance and prns and under what circumstances the prns are to be triggered using an action plan format where appropriate.  Total time for H and P, chart review, counseling,   and generating customized AVS unique to this office visit / same day charting  > 30 min summary final pulmonary f/u ov

## 2023-03-17 ENCOUNTER — Encounter (HOSPITAL_COMMUNITY): Admission: RE | Admit: 2023-03-17 | Payer: Medicare Other | Source: Ambulatory Visit

## 2023-03-17 ENCOUNTER — Other Ambulatory Visit: Payer: Medicare Other

## 2023-03-17 NOTE — Patient Instructions (Signed)
DUE TO COVID-19 ONLY TWO VISITORS  (aged 83 and older)  ARE ALLOWED TO COME WITH YOU AND STAY IN THE WAITING ROOM ONLY DURING PRE OP AND PROCEDURE.   **NO VISITORS ARE ALLOWED IN THE SHORT STAY AREA OR RECOVERY ROOM!!**  IF YOU WILL BE ADMITTED INTO THE HOSPITAL YOU ARE ALLOWED ONLY FOUR SUPPORT PEOPLE DURING VISITATION HOURS ONLY (7 AM -8PM)   The support person(s) must pass our screening, gel in and out, and wear a mask at all times, including in the patient's room. Patients must also wear a mask when staff or their support person are in the room. Visitors GUEST BADGE MUST BE WORN VISIBLY  One adult visitor may remain with you overnight and MUST be in the room by 8 P.M.     Your procedure is scheduled on: 04/01/23   Report to [facility] Main Entrance    Report to admitting at : 8:15 AM   Call this number if you have problems the morning of surgery [phone]   Do not eat food :After Midnight.   After Midnight you may have the following liquids until : 7:30 AM DAY OF SURGERY  Water Black Coffee (sugar ok, NO MILK/CREAM OR CREAMERS)  Tea (sugar ok, NO MILK/CREAM OR CREAMERS) regular and decaf                             Plain Jell-O (NO RED)                                           Fruit ices (not with fruit pulp, NO RED)                                     Popsicles (NO RED)                                                                  Juice: apple, WHITE grape, WHITE cranberry Sports drinks like Gatorade (NO RED)              FOLLOW BOWEL PREP AND ANY ADDITIONAL PRE OP INSTRUCTIONS YOU RECEIVED FROM YOUR SURGEON'S OFFICE!!!   Oral Hygiene is also important to reduce your risk of infection.                                    Remember - BRUSH YOUR TEETH THE MORNING OF SURGERY WITH YOUR REGULAR TOOTHPASTE  DENTURES WILL BE REMOVED PRIOR TO SURGERY PLEASE DO NOT APPLY "Poly grip" OR ADHESIVES!!!   Do NOT smoke after Midnight   Take these medicines the morning of  surgery with A SIP OF WATER: levothyroxine.Use eye drops as usual.                              You may not have any metal on your body including hair pins, jewelry, and body piercing  Do not wear lotions, powders, perfumes/cologne, or deodorant              Men may shave face and neck.   Do not bring valuables to the hospital. [facility] IS NOT             RESPONSIBLE   FOR VALUABLES.   Contacts, glasses, or bridgework may not be worn into surgery.   Bring small overnight bag day of surgery.   DO NOT BRING YOUR HOME MEDICATIONS TO THE HOSPITAL. PHARMACY WILL DISPENSE MEDICATIONS LISTED ON YOUR MEDICATION LIST TO YOU DURING YOUR ADMISSION IN THE HOSPITAL!    Patients discharged on the day of surgery will not be allowed to drive home.  Someone NEEDS to stay with you for the first 24 hours after anesthesia.   Special Instructions: Bring a copy of your healthcare power of attorney and living will documents         the day of surgery if you haven't scanned them before.              Please read over the following fact sheets you were given: IF YOU HAVE QUESTIONS ABOUT YOUR PRE-OP INSTRUCTIONS PLEASE CALL [phone]    [facility] - Preparing for Surgery Before surgery, you can play an important role.  Because skin is not sterile, your skin needs to be as free of germs as possible.  You can reduce the number of germs on your skin by washing with CHG (chlorahexidine gluconate) soap before surgery.  CHG is an antiseptic cleaner which kills germs and bonds with the skin to continue killing germs even after washing. Please DO NOT use if you have an allergy to CHG or antibacterial soaps.  If your skin becomes reddened/irritated stop using the CHG and inform your nurse when you arrive at Short Stay. Do not shave (including legs and underarms) for at least 48 hours prior to the first CHG shower.  You may shave your face/neck. Please follow these instructions carefully:  1.  Shower with  CHG Soap the night before surgery and the  morning of Surgery.  2.  If you choose to wash your hair, wash your hair first as usual with your  normal  shampoo.  3.  After you shampoo, rinse your hair and body thoroughly to remove the  shampoo.                           4.  Use CHG as you would any other liquid soap.  You can apply chg directly  to the skin and wash                       Gently with a scrungie or clean washcloth.  5.  Apply the CHG Soap to your body ONLY FROM THE NECK DOWN.   Do not use on face/ open                           Wound or open sores. Avoid contact with eyes, ears mouth and genitals (private parts).                       Wash face,  Genitals (private parts) with your normal soap.             6.  Wash thoroughly, paying special attention to the area where your surgery  will be performed.  7.  Thoroughly rinse your body with warm water from the neck down.  8.  DO NOT shower/wash with your normal soap after using and rinsing off  the CHG Soap.                9.  Pat yourself dry with a clean towel.            10.  Wear clean pajamas.            11.  Place clean sheets on your bed the night of your first shower and do not  sleep with pets. Day of Surgery : Do not apply any lotions/deodorants the morning of surgery.  Please wear clean clothes to the hospital/surgery center.  FAILURE TO FOLLOW THESE INSTRUCTIONS MAY RESULT IN THE CANCELLATION OF YOUR SURGERY PATIENT SIGNATURE_________________________________  NURSE SIGNATURE__________________________________

## 2023-03-18 ENCOUNTER — Encounter (HOSPITAL_COMMUNITY): Payer: Self-pay

## 2023-03-18 ENCOUNTER — Other Ambulatory Visit: Payer: Self-pay

## 2023-03-18 ENCOUNTER — Encounter (HOSPITAL_COMMUNITY)
Admission: RE | Admit: 2023-03-18 | Discharge: 2023-03-18 | Disposition: A | Discharge status: Home / Self Care | Payer: Medicare Other | Source: Ambulatory Visit | Attending: Urology | Admitting: Urology

## 2023-03-18 VITALS — BP 118/66 | HR 58 | Temp 98.0°F | Ht 64.0 in | Wt 174.0 lb

## 2023-03-18 DIAGNOSIS — R7303 Prediabetes: Secondary | ICD-10-CM | POA: Diagnosis not present

## 2023-03-18 DIAGNOSIS — Z01818 Encounter for other preprocedural examination: Secondary | ICD-10-CM

## 2023-03-18 DIAGNOSIS — Z01812 Encounter for preprocedural laboratory examination: Secondary | ICD-10-CM | POA: Diagnosis not present

## 2023-03-18 HISTORY — DX: Unspecified osteoarthritis, unspecified site: M19.90

## 2023-03-18 HISTORY — DX: Prediabetes: R73.03

## 2023-03-18 LAB — CBC
HCT: 40.5 % (ref 39.0–52.0)
Hemoglobin: 13.6 g/dL (ref 13.0–17.0)
MCH: 30.3 pg (ref 26.0–34.0)
MCHC: 33.6 g/dL (ref 30.0–36.0)
MCV: 90.2 fL (ref 80.0–100.0)
Platelets: 173 10*3/uL (ref 150–400)
RBC: 4.49 MIL/uL (ref 4.22–5.81)
RDW: 12.9 % (ref 11.5–15.5)
WBC: 4.9 10*3/uL (ref 4.0–10.5)
nRBC: 0 % (ref 0.0–0.2)

## 2023-03-18 LAB — HEMOGLOBIN A1C
Hgb A1c MFr Bld: 5.9 % — ABNORMAL HIGH (ref 4.8–5.6)
Mean Plasma Glucose: 122.63 mg/dL

## 2023-03-18 LAB — GLUCOSE, CAPILLARY: Glucose-Capillary: 89 mg/dL (ref 70–99)

## 2023-03-18 NOTE — Progress Notes (Addendum)
For Short Stay: COVID SWAB appointment date:  Bowel Prep reminder:   For Anesthesia: PCP - Dr. Tally Joe Cardiologist - N/A Pulmonologist:Dr. Nyoka Cowden.  Chest x-ray - 12/17/22 CT chest: 02/28/23 EKG - 12/17/22 Stress Test -  ECHO -  Cardiac Cath -  Pacemaker/ICD device last checked: Pacemaker orders received: Device Rep notified:  Spinal Cord Stimulator: N/A  Sleep Study - N/A CPAP -   Fasting Blood Sugar - N/A Checks Blood Sugar __0___ times a day Date and result of last Hgb A1c-  Last dose of GLP1 agonist- N/A GLP1 instructions:   Last dose of SGLT-2 inhibitors- N/A SGLT-2 instructions:   Blood Thinner Instructions: N/A Aspirin Instructions: Last Dose:  Activity level: Can go up a flight of stairs and activities of daily living without stopping and without chest pain and/or shortness of breath   Able to exercise without chest pain and/or shortness of breath  Anesthesia review: Hx: Pre-DIA.,upper airway cough syndrome(as per pt. The cough is not active at the moment).  Patient denies shortness of breath, fever, cough and chest pain at PAT appointment   Patient verbalized understanding of instructions that were given to them at the PAT appointment. Patient was also instructed that they will need to review over the PAT instructions again at home before surgery.
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
## 2023-04-01 ENCOUNTER — Observation Stay (HOSPITAL_COMMUNITY)
Admission: RE | Admit: 2023-04-01 | Discharge: 2023-04-02 | Disposition: A | Discharge status: Home / Self Care | Payer: Medicare Other | Attending: Urology | Admitting: Urology

## 2023-04-01 ENCOUNTER — Ambulatory Visit (HOSPITAL_BASED_OUTPATIENT_CLINIC_OR_DEPARTMENT_OTHER): Payer: Medicare Other | Admitting: Anesthesiology

## 2023-04-01 ENCOUNTER — Ambulatory Visit (HOSPITAL_COMMUNITY): Payer: Medicare Other | Admitting: Physician Assistant

## 2023-04-01 ENCOUNTER — Other Ambulatory Visit: Payer: Self-pay

## 2023-04-01 ENCOUNTER — Encounter (HOSPITAL_COMMUNITY): Payer: Self-pay | Admitting: Urology

## 2023-04-01 ENCOUNTER — Encounter (HOSPITAL_COMMUNITY)
Admission: RE | Disposition: A | Discharge status: Home / Self Care | Payer: Self-pay | Source: Home / Self Care | Attending: Urology

## 2023-04-01 DIAGNOSIS — N320 Bladder-neck obstruction: Secondary | ICD-10-CM | POA: Diagnosis not present

## 2023-04-01 DIAGNOSIS — N210 Calculus in bladder: Secondary | ICD-10-CM

## 2023-04-01 DIAGNOSIS — N401 Enlarged prostate with lower urinary tract symptoms: Secondary | ICD-10-CM | POA: Diagnosis not present

## 2023-04-01 DIAGNOSIS — Z79899 Other long term (current) drug therapy: Secondary | ICD-10-CM | POA: Diagnosis not present

## 2023-04-01 DIAGNOSIS — N400 Enlarged prostate without lower urinary tract symptoms: Secondary | ICD-10-CM

## 2023-04-01 DIAGNOSIS — M1990 Unspecified osteoarthritis, unspecified site: Secondary | ICD-10-CM

## 2023-04-01 DIAGNOSIS — R350 Frequency of micturition: Secondary | ICD-10-CM | POA: Diagnosis not present

## 2023-04-01 DIAGNOSIS — N138 Other obstructive and reflux uropathy: Secondary | ICD-10-CM | POA: Diagnosis not present

## 2023-04-01 DIAGNOSIS — E039 Hypothyroidism, unspecified: Secondary | ICD-10-CM | POA: Insufficient documentation

## 2023-04-01 HISTORY — PX: CYSTOSCOPY WITH LITHOLAPAXY: SHX1425

## 2023-04-01 HISTORY — PX: TRANSURETHRAL RESECTION OF PROSTATE: SHX73

## 2023-04-01 HISTORY — PX: HOLMIUM LASER APPLICATION: SHX5852

## 2023-04-01 SURGERY — CYSTOSCOPY, WITH BLADDER CALCULUS LITHOLAPAXY
Anesthesia: General | Site: Bladder

## 2023-04-01 MED ORDER — CHLORHEXIDINE GLUCONATE 0.12 % MT SOLN
15.0000 mL | Freq: Once | OROMUCOSAL | Status: AC
  Administered 2023-04-01: 15 mL via OROMUCOSAL

## 2023-04-01 MED ORDER — BISACODYL 10 MG RE SUPP: 10.0000 mg | Freq: Every day | RECTAL | Status: DC | PRN

## 2023-04-01 MED ORDER — SODIUM CHLORIDE 0.9 % IR SOLN
Status: DC | PRN
  Administered 2023-04-01 (×2): 6000 mL via INTRAVESICAL

## 2023-04-01 MED ORDER — DIPHENHYDRAMINE HCL 50 MG/ML IJ SOLN: 12.5000 mg | Freq: Four times a day (QID) | INTRAMUSCULAR | Status: DC | PRN

## 2023-04-01 MED ORDER — ACETAMINOPHEN 325 MG PO TABS: 650.0000 mg | ORAL_TABLET | ORAL | Status: DC | PRN

## 2023-04-01 MED ORDER — ORAL CARE MOUTH RINSE: 15.0000 mL | Freq: Once | OROMUCOSAL | Status: AC

## 2023-04-01 MED ORDER — EPHEDRINE SULFATE-NACL 50-0.9 MG/10ML-% IV SOSY
PREFILLED_SYRINGE | INTRAVENOUS | Status: DC | PRN
  Administered 2023-04-01: 5 mg via INTRAVENOUS

## 2023-04-01 MED ORDER — LIDOCAINE 2% (20 MG/ML) 5 ML SYRINGE
INTRAMUSCULAR | Status: DC | PRN
  Administered 2023-04-01: 100 mg via INTRAVENOUS

## 2023-04-01 MED ORDER — POTASSIUM CHLORIDE IN NACL 20-0.45 MEQ/L-% IV SOLN
INTRAVENOUS | Status: DC
  Filled 2023-04-01 (×3): qty 1000

## 2023-04-01 MED ORDER — SODIUM CHLORIDE 0.9 % IR SOLN: 3000.0000 mL | Status: DC

## 2023-04-01 MED ORDER — HYOSCYAMINE SULFATE 0.125 MG SL SUBL
0.1250 mg | SUBLINGUAL_TABLET | SUBLINGUAL | Status: DC | PRN
  Administered 2023-04-02: 0.125 mg via SUBLINGUAL
  Filled 2023-04-01: qty 1

## 2023-04-01 MED ORDER — DIPHENHYDRAMINE HCL 12.5 MG/5ML PO ELIX: 12.5000 mg | ORAL_SOLUTION | Freq: Four times a day (QID) | ORAL | Status: DC | PRN

## 2023-04-01 MED ORDER — SUGAMMADEX SODIUM 200 MG/2ML IV SOLN
INTRAVENOUS | Status: DC | PRN
  Administered 2023-04-01: 200 mg via INTRAVENOUS

## 2023-04-01 MED ORDER — ONDANSETRON HCL 4 MG/2ML IJ SOLN: 4.0000 mg | Freq: Once | INTRAMUSCULAR | Status: DC | PRN

## 2023-04-01 MED ORDER — ACETAMINOPHEN 500 MG PO TABS
1000.0000 mg | ORAL_TABLET | Freq: Once | ORAL | Status: AC
  Administered 2023-04-01: 1000 mg via ORAL
  Filled 2023-04-01: qty 2

## 2023-04-01 MED ORDER — CEFAZOLIN SODIUM-DEXTROSE 2-4 GM/100ML-% IV SOLN
2.0000 g | INTRAVENOUS | Status: AC
  Administered 2023-04-01: 2 g via INTRAVENOUS
  Filled 2023-04-01: qty 100

## 2023-04-01 MED ORDER — FENTANYL CITRATE (PF) 100 MCG/2ML IJ SOLN
INTRAMUSCULAR | Status: DC | PRN
  Administered 2023-04-01 (×2): 25 ug via INTRAVENOUS
  Administered 2023-04-01: 50 ug via INTRAVENOUS

## 2023-04-01 MED ORDER — FENTANYL CITRATE PF 50 MCG/ML IJ SOSY: 25.0000 ug | PREFILLED_SYRINGE | INTRAMUSCULAR | Status: DC | PRN

## 2023-04-01 MED ORDER — LACTATED RINGERS IV SOLN: INTRAVENOUS | Status: DC

## 2023-04-01 MED ORDER — ZOLPIDEM TARTRATE 5 MG PO TABS: 5.0000 mg | ORAL_TABLET | Freq: Every evening | ORAL | Status: DC | PRN

## 2023-04-01 MED ORDER — OXYCODONE HCL 5 MG PO TABS
5.0000 mg | ORAL_TABLET | ORAL | Status: DC | PRN
  Administered 2023-04-01: 5 mg via ORAL
  Filled 2023-04-01 (×2): qty 1

## 2023-04-01 MED ORDER — ONDANSETRON HCL 4 MG/2ML IJ SOLN
INTRAMUSCULAR | Status: DC | PRN
  Administered 2023-04-01: 4 mg via INTRAVENOUS

## 2023-04-01 MED ORDER — 0.9 % SODIUM CHLORIDE (POUR BTL) OPTIME
TOPICAL | Status: DC | PRN
  Administered 2023-04-01: 1000 mL

## 2023-04-01 MED ORDER — ONDANSETRON HCL 4 MG/2ML IJ SOLN: 4.0000 mg | INTRAMUSCULAR | Status: DC | PRN

## 2023-04-01 MED ORDER — HYDROMORPHONE HCL 1 MG/ML IJ SOLN: 0.5000 mg | INTRAMUSCULAR | Status: DC | PRN

## 2023-04-01 MED ORDER — FLEET ENEMA 7-19 GM/118ML RE ENEM: 1.0000 | ENEMA | Freq: Once | RECTAL | Status: DC | PRN

## 2023-04-01 MED ORDER — ROCURONIUM BROMIDE 10 MG/ML (PF) SYRINGE
PREFILLED_SYRINGE | INTRAVENOUS | Status: DC | PRN
  Administered 2023-04-01: 50 mg via INTRAVENOUS

## 2023-04-01 MED ORDER — DEXAMETHASONE SODIUM PHOSPHATE 10 MG/ML IJ SOLN
INTRAMUSCULAR | Status: DC | PRN
  Administered 2023-04-01: 4 mg via INTRAVENOUS

## 2023-04-01 MED ORDER — PROPOFOL 10 MG/ML IV BOLUS
INTRAVENOUS | Status: DC | PRN
  Administered 2023-04-01: 130 mg via INTRAVENOUS

## 2023-04-01 MED ORDER — PROPOFOL 10 MG/ML IV BOLUS
INTRAVENOUS | Status: AC
  Filled 2023-04-01: qty 20

## 2023-04-01 MED ORDER — SENNOSIDES-DOCUSATE SODIUM 8.6-50 MG PO TABS: 1.0000 | ORAL_TABLET | Freq: Every evening | ORAL | Status: DC | PRN

## 2023-04-01 MED ORDER — FENTANYL CITRATE (PF) 100 MCG/2ML IJ SOLN
INTRAMUSCULAR | Status: AC
  Filled 2023-04-01: qty 2

## 2023-04-01 SURGICAL SUPPLY — 23 items
BAG DRN RND TRDRP ANRFLXCHMBR (UROLOGICAL SUPPLIES) ×2
BAG URINE DRAIN 2000ML AR STRL (UROLOGICAL SUPPLIES) IMPLANT
BAG URO CATCHER STRL LF (MISCELLANEOUS) ×3 IMPLANT
CATH FOLEY 3WAY 30CC 22FR (CATHETERS) IMPLANT
CATH URETL OPEN 5X70 (CATHETERS) IMPLANT
CLOTH BEACON ORANGE TIMEOUT ST (SAFETY) ×3 IMPLANT
DRAPE FOOT SWITCH (DRAPES) ×3 IMPLANT
ELECT REM PT RETURN 15FT ADLT (MISCELLANEOUS) ×3 IMPLANT
GLOVE SURG SS PI 8.0 STRL IVOR (GLOVE) IMPLANT
GOWN STRL REUS W/ TWL XL LVL3 (GOWN DISPOSABLE) ×3 IMPLANT
GOWN STRL REUS W/TWL XL LVL3 (GOWN DISPOSABLE) ×2
HOLDER FOLEY CATH W/STRAP (MISCELLANEOUS) IMPLANT
KIT TURNOVER KIT A (KITS) IMPLANT
LASER FIB FLEXIVA PULSE ID 550 (Laser) IMPLANT
LASER FIB FLEXIVA PULSE ID 910 (Laser) IMPLANT
LOOP CUT BIPOLAR 24F LRG (ELECTROSURGICAL) IMPLANT
MANIFOLD NEPTUNE II (INSTRUMENTS) ×3 IMPLANT
PACK CYSTO (CUSTOM PROCEDURE TRAY) ×3 IMPLANT
SYR 30ML LL (SYRINGE) IMPLANT
SYR TOOMEY IRRIG 70ML (MISCELLANEOUS) ×2
SYRINGE TOOMEY IRRIG 70ML (MISCELLANEOUS) IMPLANT
TUBING CONNECTING 10 (TUBING) ×3 IMPLANT
TUBING UROLOGY SET (TUBING) ×3 IMPLANT

## 2023-04-01 NOTE — Transfer of Care (Signed)
Immediate Anesthesia Transfer of Care Note  Patient: Stephen Garcia  Procedure(s) Performed: CYSTOSCOPY WITH LITHOLAPAXY TRANSURETHRAL RESECTION OF THE PROSTATE (TURP) HOLMIUM LASER APPLICATION (Bladder)  Patient Location: PACU  Anesthesia Type:General  Level of Consciousness: awake  Airway & Oxygen Therapy: Patient Spontanous Breathing  Post-op Assessment: Report given to RN and Post -op Vital signs reviewed and stable  Post vital signs: Reviewed and stable  Last Vitals:  Vitals Value Taken Time  BP 127/77 04/01/23 1134  Temp    Pulse 59 04/01/23 1137  Resp 16 04/01/23 1137  SpO2 96 % 04/01/23 1137  Vitals shown include unvalidated device data.  Last Pain:  Vitals:   04/01/23 0836  TempSrc:   PainSc: 0-No pain         Complications: No notable events documented.

## 2023-04-01 NOTE — Op Note (Signed)
Preoperative diagnosis: Bladder outlet obstruction secondary to BPH. 2. 1.6cm bladder stone.  Postoperative diagnosis:  Bladder outlet obstruction secondary to BPH. 2. 1.6cm bladder stone  Procedure:  Cystoscopy Cystolithalopaxy of < 2.5cm stone. Transurethral Resection of the prostate  Surgeon: Bjorn Pippin. M.D.  Anesthesia: general  Complications: None  EBL: 10ml  Specimens: Prostate chips.  2. Stone fragment.  Disposition of specimens: Pathology for prostate chips.  Stone fragments to family.  Indication: Stephen Garcia is a patient with bladder outlet obstruction secondary to benign prostatic hyperplasia. After reviewing the management options for treatment, he elected to proceed with the above surgical procedure(s). We have discussed the potential benefits and risks of the procedure, side effects of the proposed treatment, the likelihood of the patient achieving the goals of the procedure, and any potential problems that might occur during the procedure or recuperation. Informed consent has been obtained.  Description of procedure:  The patient was taken to the operating room and general anesthesia was induced.  The patient was placed in the dorsal lithotomy position, prepped and draped in the usual sterile fashion, and preoperative antibiotics were administered. A preoperative time-out was performed.   Cystourethroscopy was performed.  The patient's urethra was examined and was demonstrated bilobar prostatic hypertrophy with a small median lobe with obstruction.   The bladder was then systematically examined in its entirety. There was mild trabeculation with no evidence of any bladder tumors but there was a 1.6cm tan stone noted.  The stone was fragmented with a 550 micron holmium laser fiber with the laser set on 0.8J and 8 Hz on the left and 1.0J and 15 Hz on the right.  The stone broke easily and the fragments were evacuated.   The ureteral orifices were identified  so as to  be avoided during the procedure.  The prostate adenoma was then resected utilizing loop cautery resection with the bipolar cutting loop.  The prostate adenoma from the bladder neck back to the verumontanum was resected beginning at the six o'clock position and then extended to include the right and left lobes of the prostate and anterior prostate. Care was taken not to resect distal to the verumontanum.  At the completion of the procedure the bladder was evacuated free of chips and hemostasis was insured.  Final inspection revealed intact ureteral orifices, a widely patent TUR channel and an intact external sphincter.   Hemostasis was then achieved with the cautery and the bladder was emptied and reinspected with no significant bleeding noted at the end of the procedure.    A 69fr 3 way catheter was then placed into the bladder but the foley irrigated clear so the CBI port was plugged and placed to straight drainage.  The patient  tolerated the procedure well and without complications.  The patient was able to be awakened and transferred to the recovery unit in satisfactory condition.

## 2023-04-01 NOTE — Plan of Care (Signed)
  Problem: Education: Goal: Knowledge of General Education information will improve Description Including pain rating scale, medication(s)/side effects and non-pharmacologic comfort measures Outcome: Progressing   Problem: Health Behavior/Discharge Planning: Goal: Ability to manage health-related needs will improve Outcome: Progressing   

## 2023-04-01 NOTE — Anesthesia Postprocedure Evaluation (Signed)
Anesthesia Post Note  Patient: Stephen Garcia  Procedure(s) Performed: CYSTOSCOPY WITH LITHOLAPAXY TRANSURETHRAL RESECTION OF THE PROSTATE (TURP) HOLMIUM LASER APPLICATION (Bladder)     Patient location during evaluation: PACU Anesthesia Type: General Level of consciousness: awake and alert Pain management: pain level controlled Vital Signs Assessment: post-procedure vital signs reviewed and stable Respiratory status: spontaneous breathing, nonlabored ventilation, respiratory function stable and patient connected to nasal cannula oxygen Cardiovascular status: blood pressure returned to baseline and stable Postop Assessment: no apparent nausea or vomiting Anesthetic complications: no   No notable events documented.  Last Vitals:  Vitals:   04/01/23 1200 04/01/23 1220  BP: (!) 130/56 (!) 151/64  Pulse: (!) 54 (!) 48  Resp: 16 18  Temp:  36.5 C  SpO2:  100%    Last Pain:  Vitals:   04/01/23 1331  TempSrc:   PainSc: 0-No pain                 Collene Schlichter

## 2023-04-01 NOTE — Anesthesia Procedure Notes (Signed)
Procedure Name: Intubation Date/Time: 04/01/2023 10:30 AM  Performed by: Ezekiel Ina, CRNAPre-anesthesia Checklist: Patient identified, Emergency Drugs available, Suction available and Patient being monitored Patient Re-evaluated:Patient Re-evaluated prior to induction Oxygen Delivery Method: Circle system utilized Preoxygenation: Pre-oxygenation with 100% oxygen Induction Type: IV induction Ventilation: Mask ventilation without difficulty Laryngoscope Size: Miller and 2 Grade View: Grade I Tube type: Oral Tube size: 7.0 mm Number of attempts: 1 Airway Equipment and Method: Stylet Placement Confirmation: ETT inserted through vocal cords under direct vision, positive ETCO2 and breath sounds checked- equal and bilateral Secured at: 21 cm Tube secured with: Tape Dental Injury: Teeth and Oropharynx as per pre-operative assessment

## 2023-04-01 NOTE — Anesthesia Preprocedure Evaluation (Signed)
Anesthesia Evaluation  Patient identified by MRN, date of birth, ID band Patient awake    Reviewed: Allergy & Precautions, NPO status , Patient's Chart, lab work & pertinent test results  Airway Mallampati: II  TM Distance: >3 FB Neck ROM: Full    Dental  (+) Teeth Intact, Dental Advisory Given   Pulmonary neg pulmonary ROS   Pulmonary exam normal breath sounds clear to auscultation       Cardiovascular negative cardio ROS Normal cardiovascular exam Rhythm:Regular Rate:Normal     Neuro/Psych negative neurological ROS     GI/Hepatic Neg liver ROS,GERD  ,,  Endo/Other  Hypothyroidism    Renal/GU negative Renal ROS    BENIGN PROSTATE HYPERPLASIA WITH BLADDER OUTLET OBSTRUCTION  AND BLADDER STONE    Musculoskeletal  (+) Arthritis ,    Abdominal   Peds  Hematology negative hematology ROS (+)   Anesthesia Other Findings Day of surgery medications reviewed with the patient.  Reproductive/Obstetrics                             Anesthesia Physical Anesthesia Plan  ASA: 3  Anesthesia Plan: General   Post-op Pain Management: Tylenol PO (pre-op)*   Induction: Intravenous  PONV Risk Score and Plan: 3 and Dexamethasone and Ondansetron  Airway Management Planned: Oral ETT  Additional Equipment:   Intra-op Plan:   Post-operative Plan: Extubation in OR  Informed Consent: I have reviewed the patients History and Physical, chart, labs and discussed the procedure including the risks, benefits and alternatives for the proposed anesthesia with the patient or authorized representative who has indicated his/her understanding and acceptance.     Dental advisory given  Plan Discussed with: CRNA  Anesthesia Plan Comments:        Anesthesia Quick Evaluation

## 2023-04-01 NOTE — Interval H&P Note (Signed)
History and Physical Interval Note:  I reviewed the risks of the procedure with him again.   04/01/2023 8:53 AM  Stephen Garcia  has presented today for surgery, with the diagnosis of BENIGN PROSTATE HYPERPLASIA WITH BLADDER OUTLET OBSTRUCTION  AND BLADDER STONE.  The various methods of treatment have been discussed with the patient and family. After consideration of risks, benefits and other options for treatment, the patient has consented to  Procedure(s): CYSTOSCOPY WITH LITHOLAPAXY (N/A) TRANSURETHRAL RESECTION OF THE PROSTATE (TURP) (N/A) as a surgical intervention.  The patient's history has been reviewed, patient examined, no change in status, stable for surgery.  I have reviewed the patient's chart and labs.  Questions were answered to the patient's satisfaction.     Bjorn Pippin

## 2023-04-02 ENCOUNTER — Encounter (HOSPITAL_COMMUNITY): Payer: Self-pay | Admitting: Urology

## 2023-04-02 DIAGNOSIS — N401 Enlarged prostate with lower urinary tract symptoms: Secondary | ICD-10-CM | POA: Diagnosis not present

## 2023-04-02 DIAGNOSIS — N210 Calculus in bladder: Secondary | ICD-10-CM | POA: Diagnosis present

## 2023-04-02 DIAGNOSIS — Z79899 Other long term (current) drug therapy: Secondary | ICD-10-CM | POA: Diagnosis not present

## 2023-04-02 DIAGNOSIS — R350 Frequency of micturition: Secondary | ICD-10-CM | POA: Diagnosis not present

## 2023-04-02 DIAGNOSIS — E039 Hypothyroidism, unspecified: Secondary | ICD-10-CM | POA: Diagnosis not present

## 2023-04-02 LAB — SURGICAL PATHOLOGY

## 2023-04-02 NOTE — Discharge Summary (Signed)
Physician Discharge Summary  Patient ID: Stephen Garcia MRN: [id number] DOB/AGE: 83-Jun-1942 82 y.o.  Admit date: 04/01/2023 Discharge date: 04/02/2023  Admission Diagnoses:  Benign localized prostatic hyperplasia with lower urinary tract symptoms (LUTS)  Discharge Diagnoses:  Principal Problem:   Benign localized prostatic hyperplasia with lower urinary tract symptoms (LUTS) Active Problems:   Bladder stone   Past Medical History:  Diagnosis Date   Arthritis    Glaucoma    Hyperlipidemia    Hypothyroidism    Pre-diabetes     Surgeries: Procedure(s): CYSTOSCOPY WITH LITHOLAPAXY TRANSURETHRAL RESECTION OF THE PROSTATE (TURP) HOLMIUM LASER APPLICATION on 04/01/2023   Consultants (if any):   Discharged Condition: Improved  Hospital Course: MARQUEE WAVRA is an 82 y.o. male who was admitted 04/01/2023 with a diagnosis of Benign localized prostatic hyperplasia with lower urinary tract symptoms (LUTS) and went to the operating room on 04/01/2023 and underwent the above named procedures.  His urine remained clear and he had a successful voiding trial this morning.   He was given perioperative antibiotics:  Anti-infectives (From admission, onward)    Start     Dose/Rate Route Frequency Ordered Stop   04/01/23 0813  ceFAZolin (ANCEF) IVPB 2g/100 mL premix        2 g 200 mL/hr over 30 Minutes Intravenous 30 min pre-op 04/01/23 0813 04/01/23 1036     .  He was given sequential compression devices for DVT prophylaxis.  He benefited maximally from the hospital stay and there were no complications.    Recent vital signs:  Vitals:   04/02/23 0023 04/02/23 0442  BP: (!) 110/56 (!) 101/54  Pulse: (!) 49 (!) 48  Resp: 16 16  Temp: 98 F (36.7 C) 99.1 F (37.3 C)  SpO2: 100% 99%    Recent laboratory studies:  Lab Results  Component Value Date   HGB 13.6 03/18/2023   HGB 15.0 12/17/2022   HGB 15.0 12/17/2022   Lab Results  Component Value Date   WBC 4.9 03/18/2023   PLT 173  03/18/2023   Lab Results  Component Value Date   INR 1.0 12/17/2022   Lab Results  Component Value Date   NA 138 12/17/2022   K 4.0 12/17/2022   CL 104 12/17/2022   CO2 23 12/17/2022   BUN 10 12/17/2022   CREATININE 1.00 12/17/2022   GLUCOSE 103 (H) 12/17/2022    Discharge Medications:   Allergies as of 04/02/2023       Reactions   Fluticasone-salmeterol Itching        Medication List     STOP taking these medications    ibuprofen 600 MG tablet Commonly known as: ADVIL       TAKE these medications    atorvastatin 20 MG tablet Commonly known as: LIPITOR Take 20 mg by mouth daily.   brimonidine 0.1 % Soln Commonly known as: ALPHAGAN P Place 1 drop into the left eye in the morning, at noon, and at bedtime.   dorzolamide-timolol 2-0.5 % ophthalmic solution Commonly known as: COSOPT Place 1 drop into both eyes in the morning, at noon, and at bedtime.   ergocalciferol 1.25 MG (50000 UT) capsule Commonly known as: VITAMIN D2 Take 50,000 Units by mouth once a week.   levothyroxine 112 MCG tablet Commonly known as: SYNTHROID Take 1 tablet (112 mcg total) by mouth daily.   pilocarpine 1 % ophthalmic solution Commonly known as: PILOCAR Place 1 drop into the left eye 4 (four) times daily.   Rocklatan  0.02-0.005 % Soln Generic drug: Netarsudil-Latanoprost Place 1 drop into both eyes at bedtime.        Diagnostic Studies: No results found.  Disposition: Discharge disposition: 01-Home or Self Care          Follow-up Information     Sable Feil, PA-C Follow up on 04/11/2023.   Specialty: Urology Why: 9:30a Contact information: [address] [place] [place] [address] [phone]                  Signed: Bjorn Pippin 04/02/2023, 10:14 AM

## 2023-04-02 NOTE — Plan of Care (Signed)
  Problem: Education: Goal: Knowledge of General Education information will improve Description: Including pain rating scale, medication(s)/side effects and non-pharmacologic comfort measures 04/02/2023 1014 by Val Eagle, RN Outcome: Progressing 04/01/2023 2015 by Val Eagle, RN Outcome: Progressing   Problem: Health Behavior/Discharge Planning: Goal: Ability to manage health-related needs will improve 04/02/2023 1014 by Val Eagle, RN Outcome: Progressing 04/01/2023 2015 by Val Eagle, RN Outcome: Progressing

## 2023-04-11 DIAGNOSIS — R3912 Poor urinary stream: Secondary | ICD-10-CM | POA: Diagnosis not present

## 2023-04-17 DIAGNOSIS — E7800 Pure hypercholesterolemia, unspecified: Secondary | ICD-10-CM | POA: Diagnosis not present

## 2023-04-17 DIAGNOSIS — M545 Low back pain, unspecified: Secondary | ICD-10-CM | POA: Diagnosis not present

## 2023-04-17 DIAGNOSIS — I2510 Atherosclerotic heart disease of native coronary artery without angina pectoris: Secondary | ICD-10-CM | POA: Diagnosis not present

## 2023-04-17 DIAGNOSIS — I700 Atherosclerosis of aorta: Secondary | ICD-10-CM | POA: Diagnosis not present

## 2023-04-17 DIAGNOSIS — R55 Syncope and collapse: Secondary | ICD-10-CM | POA: Diagnosis not present

## 2023-04-25 DIAGNOSIS — M545 Low back pain, unspecified: Secondary | ICD-10-CM | POA: Diagnosis not present

## 2023-05-02 DIAGNOSIS — M545 Low back pain, unspecified: Secondary | ICD-10-CM | POA: Diagnosis not present

## 2023-05-06 DIAGNOSIS — H532 Diplopia: Secondary | ICD-10-CM | POA: Diagnosis not present

## 2023-05-09 DIAGNOSIS — R3912 Poor urinary stream: Secondary | ICD-10-CM | POA: Diagnosis not present

## 2023-05-09 DIAGNOSIS — R350 Frequency of micturition: Secondary | ICD-10-CM | POA: Diagnosis not present

## 2023-05-12 DIAGNOSIS — H402233 Chronic angle-closure glaucoma, bilateral, severe stage: Secondary | ICD-10-CM | POA: Diagnosis not present

## 2023-05-12 DIAGNOSIS — M545 Low back pain, unspecified: Secondary | ICD-10-CM | POA: Diagnosis not present
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
## 2023-05-14 ENCOUNTER — Ambulatory Visit: Payer: Medicare Other | Admitting: Neurology

## 2023-05-14 ENCOUNTER — Encounter: Payer: Self-pay | Admitting: Neurology

## 2023-05-14 VITALS — BP 136/66 | HR 68 | Ht 63.0 in | Wt 170.0 lb

## 2023-05-14 DIAGNOSIS — R4020 Unspecified coma: Secondary | ICD-10-CM

## 2023-05-14 DIAGNOSIS — R054 Cough syncope: Secondary | ICD-10-CM | POA: Diagnosis not present

## 2023-05-14 DIAGNOSIS — R55 Syncope and collapse: Secondary | ICD-10-CM | POA: Diagnosis not present
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
## 2023-06-17 ENCOUNTER — Ambulatory Visit: Payer: Medicare Other | Admitting: Neurology

## 2023-06-17 DIAGNOSIS — R4020 Unspecified coma: Secondary | ICD-10-CM | POA: Diagnosis not present

## 2023-06-17 NOTE — Progress Notes (Signed)
Ambulatory EEG hooked up and running. Light flashing. Push button tested. Camera and event log explained. Batteries explained. Patient understood.   

## 2023-06-18 ENCOUNTER — Telehealth: Payer: Self-pay | Admitting: *Deleted

## 2023-06-18 NOTE — Progress Notes (Signed)
AMB EEG discontinued. No skin breakdown at [facility]. Pt did not use camera. No events per patient.

## 2023-06-24 NOTE — Progress Notes (Signed)
ELECTROENCEPHALOGRAM REPORT  Dates of Recording: 06/17/2023 at 14:08 to 06/18/2023 at 15:47  Patient's Name: Stephen Garcia MRN: [id number] Date of Birth: [birth year]  Referring Provider: Shon Millet  Procedure: 24-hour ambulatory EEG  History: patient with episode of loss of conscious as well as cough syncope.  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  Medications:  Current Outpatient Medications on File Prior to Visit  Medication Sig Dispense Refill   atorvastatin (LIPITOR) 20 MG tablet Take 20 mg by mouth daily.     brimonidine (ALPHAGAN P) 0.1 % SOLN Place 1 drop into the left eye in the morning, at noon, and at bedtime.     dorzolamide-timolol (COSOPT) 2-0.5 % ophthalmic solution Place 1 drop into both eyes in the morning, at noon, and at bedtime.     ergocalciferol (VITAMIN D2) 50000 UNITS capsule Take 50,000 Units by mouth once a week.     levothyroxine (SYNTHROID) 112 MCG tablet Take 1 tablet (112 mcg total) by mouth daily. 90 tablet 3   pilocarpine (PILOCAR) 1 % ophthalmic solution Place 1 drop into the left eye 4 (four) times daily.     predniSONE (STERAPRED UNI-PAK 21 TAB) 10 MG (21) TBPK tablet Take 10 mg by mouth daily.     ROCKLATAN 0.02-0.005 % SOLN Place 1 drop into both eyes at bedtime.     No current facility-administered medications on file prior to visit.    Technical Summary: This is a 24-hour multichannel digital EEG recording measured by the international 10-20 system with electrodes applied with paste and impedances below 5000 ohms performed as portable with EKG monitoring.  The digital EEG was referentially recorded, reformatted, and digitally filtered in a variety of bipolar and referential montages for optimal display.    DESCRIPTION OF RECORDING: During maximal wakefulness, the background activity consisted of a symmetric 8-9Hz  posterior dominant rhythm which was reactive to eye opening.  There were no epileptiform discharges or focal slowing seen in  wakefulness.  During the recording, the patient progresses through wakefulness, drowsiness, and Stage 2 sleep.  Again, there were no epileptiform discharges seen.  Events:  There were no electrographic seizures seen.  EKG lead was unremarkable.  IMPRESSION: This 24-hour ambulatory EEG study is normal.    CLINICAL CORRELATION: A normal EEG does not exclude a clinical diagnosis of epilepsy.  If further clinical questions remain, inpatient video EEG monitoring may be helpful.   Shon Millet, DO

## 2023-06-30 DIAGNOSIS — H402233 Chronic angle-closure glaucoma, bilateral, severe stage: Secondary | ICD-10-CM | POA: Diagnosis not present

## 2023-06-30 DIAGNOSIS — T85398A Other mechanical complication of other ocular prosthetic devices, implants and grafts, initial encounter: Secondary | ICD-10-CM | POA: Diagnosis not present
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
## 2023-07-01 ENCOUNTER — Telehealth: Payer: Self-pay | Admitting: Neurology

## 2023-07-01 ENCOUNTER — Ambulatory Visit: Payer: Medicare Other | Admitting: Neurology

## 2023-07-01 ENCOUNTER — Encounter: Payer: Self-pay | Admitting: Neurology

## 2023-07-01 DIAGNOSIS — R4020 Unspecified coma: Secondary | ICD-10-CM | POA: Diagnosis not present

## 2023-07-01 NOTE — Telephone Encounter (Signed)
Pt is calling in stating that he spoke with [facility] and they are needing a letter compose to say if he is able to drive again and condition (DX) he would like for it to be faxed to [phone] (F) or [phone] (P).  Pt would like to have a copy of the letter for his records and would like to pick it up once it is ready.
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
## 2023-07-07 NOTE — Telephone Encounter (Signed)
Pt is calling in to check the status of the letter for the [facility] and he was given the message from Dr. Everlena Cooper.  Pt stated that he will give the [facility] a call back to let them know what was stated by the office.

## 2023-07-08 NOTE — Telephone Encounter (Signed)
Called pt and left message that he will need to get the form from and DMV and Dr. Everlena Cooper will fill it out.

## 2023-07-09 NOTE — Telephone Encounter (Signed)
Called pt and he has called the [facility] and they are going to mail him the form. He also wanted a appointment with Dr. Everlena Cooper I transferred him to the front.

## 2023-07-16 DIAGNOSIS — T85398A Other mechanical complication of other ocular prosthetic devices, implants and grafts, initial encounter: Secondary | ICD-10-CM | POA: Diagnosis not present

## 2023-07-16 NOTE — Telephone Encounter (Signed)
Pt is calling in stating that he has spoke with [facility] and they are still needing a letter from Dr. Everlena Cooper to send.  Pt would like to make an appointment with Dr. Everlena Cooper to explain what he is really needing and there aren't any appointment until March of 2025.  Pt would like to have a call back.

## 2023-07-18 ENCOUNTER — Telehealth: Payer: Self-pay | Admitting: Neurology

## 2023-07-18 NOTE — Telephone Encounter (Signed)
Patient called stating that he was trying to reach the nurse to get a call back

## 2023-07-18 NOTE — Telephone Encounter (Signed)
Called pt and talked to him and told him that we have no papers for [facility]. He has some and I told him to bring them up to the office and we will make copies and try and figure out what is needed for him. He understood. And said he would bring them but said no date.

## 2023-07-22 ENCOUNTER — Ambulatory Visit: Payer: Medicare Other | Admitting: Primary Care

## 2023-07-25 ENCOUNTER — Telehealth: Payer: Self-pay | Admitting: Neurology

## 2023-07-25 NOTE — Telephone Encounter (Signed)
Patient advised of DR.Jaffe note at his last visit, 1. Loss of consciousness - unknown etiology.  This wasn't one of his coughing syncope spells.   2.  Cough syncope   Overall, workup unrevealing.  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  No signal change in the hippocampus to suggest source of seizures.  I looked back at his MRI from 2017 and it does show it on that imaging as well, even though there is no mention in the report.  Both routine and 24 hour ambulatory EEGs were normal.  As etiology is uncertain, workup unrevealing and this was an isolated event, I don't think initiating an antiepileptic medication is indicated.   Patient advise to bring his paperwork to the office no need for a visit.

## 2023-07-25 NOTE — Telephone Encounter (Signed)
Pt said he needs an appt with jaffe asap. He needs papers signed and he feels like he is getting the run around. He needs papers signed from [facility] stating he can drive.

## 2023-07-29 DIAGNOSIS — E039 Hypothyroidism, unspecified: Secondary | ICD-10-CM | POA: Diagnosis not present

## 2023-07-29 DIAGNOSIS — K219 Gastro-esophageal reflux disease without esophagitis: Secondary | ICD-10-CM | POA: Diagnosis not present

## 2023-07-29 DIAGNOSIS — Z79899 Other long term (current) drug therapy: Secondary | ICD-10-CM | POA: Diagnosis not present

## 2023-07-29 DIAGNOSIS — H402213 Chronic angle-closure glaucoma, right eye, severe stage: Secondary | ICD-10-CM | POA: Diagnosis not present

## 2023-07-29 DIAGNOSIS — T85398A Other mechanical complication of other ocular prosthetic devices, implants and grafts, initial encounter: Secondary | ICD-10-CM | POA: Diagnosis not present

## 2023-07-30 DIAGNOSIS — H402233 Chronic angle-closure glaucoma, bilateral, severe stage: Secondary | ICD-10-CM | POA: Diagnosis not present

## 2023-07-30 DIAGNOSIS — H402213 Chronic angle-closure glaucoma, right eye, severe stage: Secondary | ICD-10-CM | POA: Diagnosis not present

## 2023-07-30 DIAGNOSIS — H402223 Chronic angle-closure glaucoma, left eye, severe stage: Secondary | ICD-10-CM | POA: Diagnosis not present

## 2023-07-31 DIAGNOSIS — G4486 Cervicogenic headache: Secondary | ICD-10-CM | POA: Diagnosis not present

## 2023-07-31 DIAGNOSIS — Z23 Encounter for immunization: Secondary | ICD-10-CM | POA: Diagnosis not present

## 2023-07-31 DIAGNOSIS — E039 Hypothyroidism, unspecified: Secondary | ICD-10-CM | POA: Diagnosis not present

## 2023-07-31 DIAGNOSIS — M810 Age-related osteoporosis without current pathological fracture: Secondary | ICD-10-CM | POA: Diagnosis not present

## 2023-07-31 DIAGNOSIS — R7303 Prediabetes: Secondary | ICD-10-CM | POA: Diagnosis not present

## 2023-07-31 DIAGNOSIS — E559 Vitamin D deficiency, unspecified: Secondary | ICD-10-CM | POA: Diagnosis not present

## 2023-07-31 DIAGNOSIS — M1990 Unspecified osteoarthritis, unspecified site: Secondary | ICD-10-CM | POA: Diagnosis not present

## 2023-07-31 DIAGNOSIS — E782 Mixed hyperlipidemia: Secondary | ICD-10-CM | POA: Diagnosis not present

## 2023-07-31 DIAGNOSIS — R053 Chronic cough: Secondary | ICD-10-CM | POA: Diagnosis not present

## 2023-07-31 DIAGNOSIS — M5030 Other cervical disc degeneration, unspecified cervical region: Secondary | ICD-10-CM | POA: Diagnosis not present

## 2023-07-31 DIAGNOSIS — Z0000 Encounter for general adult medical examination without abnormal findings: Secondary | ICD-10-CM | POA: Diagnosis not present

## 2023-08-12 ENCOUNTER — Telehealth: Payer: Self-pay | Admitting: Neurology

## 2023-08-12 DIAGNOSIS — Z0279 Encounter for issue of other medical certificate: Secondary | ICD-10-CM

## 2023-08-12 NOTE — Telephone Encounter (Signed)
Pt came in and dropped off different DMV paperwork. Took $25 form fee per [facility]

## 2023-08-19 ENCOUNTER — Telehealth: Payer: Self-pay | Admitting: Neurology

## 2023-08-19 NOTE — Telephone Encounter (Signed)
Patient called checking on DMV forms

## 2023-08-19 NOTE — Telephone Encounter (Signed)
Advised patient wife, Once paperwork is complete we will call patient. Patient was advised at the time of paperwork taken it will be two week.

## 2023-08-22 NOTE — Telephone Encounter (Signed)
DMV forms filled out and faxed.

## 2023-08-22 NOTE — Telephone Encounter (Signed)
Patient called about the paperwork and is wanting a phone call back, Patient said he is on the verge of losing his driving licenses. He said it is suppose to be faxed

## 2023-08-25 ENCOUNTER — Other Ambulatory Visit: Payer: Medicare Other

## 2023-08-27 ENCOUNTER — Telehealth: Payer: Self-pay | Admitting: Neurology

## 2023-08-27 NOTE — Telephone Encounter (Signed)
Pt called in wanting to get a status of his DMV paperwork. He says a message can be left on his voicemail if he doesn't answer.

## 2023-08-27 NOTE — Telephone Encounter (Signed)
[facility] Paperwork faxed over.

## 2023-08-29 ENCOUNTER — Ambulatory Visit: Payer: Medicare Other | Admitting: Endocrinology

## 2023-08-29 DIAGNOSIS — M5030 Other cervical disc degeneration, unspecified cervical region: Secondary | ICD-10-CM | POA: Diagnosis not present

## 2023-08-29 DIAGNOSIS — M25511 Pain in right shoulder: Secondary | ICD-10-CM | POA: Diagnosis not present

## 2023-08-29 DIAGNOSIS — M810 Age-related osteoporosis without current pathological fracture: Secondary | ICD-10-CM | POA: Diagnosis not present

## 2023-08-30 ENCOUNTER — Encounter (HOSPITAL_COMMUNITY): Payer: Self-pay | Admitting: Emergency Medicine

## 2023-08-30 ENCOUNTER — Emergency Department (HOSPITAL_COMMUNITY): Payer: Medicare Other

## 2023-08-30 ENCOUNTER — Other Ambulatory Visit: Payer: Self-pay

## 2023-08-30 ENCOUNTER — Inpatient Hospital Stay (HOSPITAL_COMMUNITY)
Admission: EM | Admit: 2023-08-30 | Discharge: 2023-09-02 | DRG: 322 | Disposition: A | Discharge status: Home / Self Care | Payer: Medicare Other | Attending: Cardiovascular Disease | Admitting: Cardiovascular Disease

## 2023-08-30 DIAGNOSIS — Z8249 Family history of ischemic heart disease and other diseases of the circulatory system: Secondary | ICD-10-CM

## 2023-08-30 DIAGNOSIS — I214 Non-ST elevation (NSTEMI) myocardial infarction: Secondary | ICD-10-CM | POA: Diagnosis not present

## 2023-08-30 DIAGNOSIS — R072 Precordial pain: Secondary | ICD-10-CM | POA: Diagnosis not present

## 2023-08-30 DIAGNOSIS — E7800 Pure hypercholesterolemia, unspecified: Secondary | ICD-10-CM | POA: Diagnosis present

## 2023-08-30 DIAGNOSIS — Z7982 Long term (current) use of aspirin: Secondary | ICD-10-CM | POA: Diagnosis not present

## 2023-08-30 DIAGNOSIS — Z9079 Acquired absence of other genital organ(s): Secondary | ICD-10-CM | POA: Diagnosis not present

## 2023-08-30 DIAGNOSIS — E039 Hypothyroidism, unspecified: Secondary | ICD-10-CM | POA: Diagnosis not present

## 2023-08-30 DIAGNOSIS — J439 Emphysema, unspecified: Secondary | ICD-10-CM | POA: Diagnosis not present

## 2023-08-30 DIAGNOSIS — Z79890 Hormone replacement therapy (postmenopausal): Secondary | ICD-10-CM

## 2023-08-30 DIAGNOSIS — H409 Unspecified glaucoma: Secondary | ICD-10-CM | POA: Diagnosis not present

## 2023-08-30 DIAGNOSIS — Z79899 Other long term (current) drug therapy: Secondary | ICD-10-CM | POA: Diagnosis not present

## 2023-08-30 DIAGNOSIS — I209 Angina pectoris, unspecified: Secondary | ICD-10-CM | POA: Diagnosis not present

## 2023-08-30 DIAGNOSIS — Z743 Need for continuous supervision: Secondary | ICD-10-CM | POA: Diagnosis not present

## 2023-08-30 DIAGNOSIS — Z888 Allergy status to other drugs, medicaments and biological substances status: Secondary | ICD-10-CM | POA: Diagnosis not present

## 2023-08-30 DIAGNOSIS — M1990 Unspecified osteoarthritis, unspecified site: Secondary | ICD-10-CM | POA: Diagnosis present

## 2023-08-30 DIAGNOSIS — R110 Nausea: Secondary | ICD-10-CM | POA: Diagnosis not present

## 2023-08-30 DIAGNOSIS — E7841 Elevated Lipoprotein(a): Secondary | ICD-10-CM

## 2023-08-30 DIAGNOSIS — I10 Essential (primary) hypertension: Secondary | ICD-10-CM | POA: Diagnosis not present

## 2023-08-30 DIAGNOSIS — I25110 Atherosclerotic heart disease of native coronary artery with unstable angina pectoris: Secondary | ICD-10-CM | POA: Diagnosis present

## 2023-08-30 DIAGNOSIS — R61 Generalized hyperhidrosis: Secondary | ICD-10-CM | POA: Diagnosis not present

## 2023-08-30 DIAGNOSIS — H402210 Chronic angle-closure glaucoma, right eye, stage unspecified: Secondary | ICD-10-CM | POA: Diagnosis not present

## 2023-08-30 DIAGNOSIS — Z825 Family history of asthma and other chronic lower respiratory diseases: Secondary | ICD-10-CM

## 2023-08-30 DIAGNOSIS — R7303 Prediabetes: Secondary | ICD-10-CM | POA: Diagnosis not present

## 2023-08-30 DIAGNOSIS — I700 Atherosclerosis of aorta: Secondary | ICD-10-CM | POA: Diagnosis not present

## 2023-08-30 DIAGNOSIS — I2510 Atherosclerotic heart disease of native coronary artery without angina pectoris: Secondary | ICD-10-CM | POA: Diagnosis not present

## 2023-08-30 DIAGNOSIS — R079 Chest pain, unspecified: Secondary | ICD-10-CM | POA: Diagnosis not present

## 2023-08-30 DIAGNOSIS — Z955 Presence of coronary angioplasty implant and graft: Secondary | ICD-10-CM

## 2023-08-30 DIAGNOSIS — I77819 Aortic ectasia, unspecified site: Secondary | ICD-10-CM | POA: Diagnosis not present

## 2023-08-30 DIAGNOSIS — I499 Cardiac arrhythmia, unspecified: Secondary | ICD-10-CM | POA: Diagnosis not present

## 2023-08-30 DIAGNOSIS — R0602 Shortness of breath: Secondary | ICD-10-CM | POA: Diagnosis not present

## 2023-08-30 LAB — BASIC METABOLIC PANEL
Anion gap: 8 (ref 5–15)
BUN: 14 mg/dL (ref 8–23)
CO2: 21 mmol/L — ABNORMAL LOW (ref 22–32)
Calcium: 9.1 mg/dL (ref 8.9–10.3)
Chloride: 106 mmol/L (ref 98–111)
Creatinine, Ser: 0.92 mg/dL (ref 0.61–1.24)
GFR, Estimated: >60 mL/min (ref >60)
Glucose, Bld: 213 mg/dL — ABNORMAL HIGH (ref 70–99)
Potassium: 3.9 mmol/L (ref 3.5–5.1)
Sodium: 135 mmol/L (ref 135–145)

## 2023-08-30 LAB — CBC
HCT: 38.2 % — ABNORMAL LOW (ref 39.0–52.0)
Hemoglobin: 12.6 g/dL — ABNORMAL LOW (ref 13.0–17.0)
MCH: 29.6 pg (ref 26.0–34.0)
MCHC: 33 g/dL (ref 30.0–36.0)
MCV: 89.9 fL (ref 80.0–100.0)
Platelets: 174 10*3/uL (ref 150–400)
RBC: 4.25 MIL/uL (ref 4.22–5.81)
RDW: 12.8 % (ref 11.5–15.5)
WBC: 9.2 10*3/uL (ref 4.0–10.5)
nRBC: 0 % (ref 0.0–0.2)

## 2023-08-30 LAB — HEPATIC FUNCTION PANEL
ALT: 16 U/L (ref 0–44)
AST: 22 U/L (ref 15–41)
Albumin: 3.6 g/dL (ref 3.5–5.0)
Alkaline Phosphatase: 38 U/L (ref 38–126)
Bilirubin, Direct: 0.1 mg/dL (ref 0.0–0.2)
Indirect Bilirubin: 0.6 mg/dL (ref 0.3–0.9)
Total Bilirubin: 0.7 mg/dL (ref 0.3–1.2)
Total Protein: 6.7 g/dL (ref 6.5–8.1)

## 2023-08-30 LAB — TROPONIN I (HIGH SENSITIVITY)
Troponin I (High Sensitivity): 340 ng/L (ref <18)
Troponin I (High Sensitivity): 908 ng/L (ref <18)

## 2023-08-30 LAB — D-DIMER, QUANTITATIVE: D-Dimer, Quant: 0.94 ug{FEU}/mL — ABNORMAL HIGH (ref 0.00–0.50)

## 2023-08-30 MED ORDER — NETARSUDIL-LATANOPROST 0.02-0.005 % OP SOLN
1.0000 [drp] | Freq: Every day | OPHTHALMIC | Status: DC
  Administered 2023-09-01: 1 [drp] via OPHTHALMIC

## 2023-08-30 MED ORDER — LEVOTHYROXINE SODIUM 112 MCG PO TABS
112.0000 ug | ORAL_TABLET | Freq: Every day | ORAL | Status: DC
  Administered 2023-08-31 – 2023-09-02 (×3): 112 ug via ORAL
  Filled 2023-08-30 (×3): qty 1

## 2023-08-30 MED ORDER — ACETAMINOPHEN 325 MG PO TABS
650.0000 mg | ORAL_TABLET | ORAL | Status: DC | PRN
Start: 2023-08-30 — End: 2023-09-02
  Administered 2023-09-01 (×2): 650 mg via ORAL
  Filled 2023-08-30 (×2): qty 2

## 2023-08-30 MED ORDER — ASPIRIN 81 MG PO TBEC
81.0000 mg | DELAYED_RELEASE_TABLET | Freq: Every day | ORAL | Status: DC
  Administered 2023-08-31 – 2023-09-02 (×2): 81 mg via ORAL
  Filled 2023-08-30 (×2): qty 1

## 2023-08-30 MED ORDER — BRIMONIDINE TARTRATE 0.15 % OP SOLN
1.0000 [drp] | Freq: Three times a day (TID) | OPHTHALMIC | Status: DC
  Administered 2023-08-30 – 2023-09-02 (×6): 1 [drp] via OPHTHALMIC
  Filled 2023-08-30 (×3): qty 5

## 2023-08-30 MED ORDER — PILOCARPINE HCL 1 % OP SOLN
1.0000 [drp] | Freq: Four times a day (QID) | OPHTHALMIC | Status: DC
Start: 2023-08-30 — End: 2023-09-02
  Administered 2023-08-30 – 2023-09-02 (×6): 1 [drp] via OPHTHALMIC
  Filled 2023-08-30 (×2): qty 15

## 2023-08-30 MED ORDER — HEPARIN (PORCINE) 25000 UT/250ML-% IV SOLN
850.0000 [IU]/h | INTRAVENOUS | Status: DC
  Administered 2023-08-30: 900 [IU]/h via INTRAVENOUS
  Administered 2023-08-31: 850 [IU]/h via INTRAVENOUS
  Filled 2023-08-30 (×2): qty 250

## 2023-08-30 MED ORDER — ATORVASTATIN CALCIUM 40 MG PO TABS
40.0000 mg | ORAL_TABLET | Freq: Every day | ORAL | Status: DC
  Administered 2023-08-31 – 2023-09-02 (×3): 40 mg via ORAL
  Filled 2023-08-30 (×3): qty 1

## 2023-08-30 MED ORDER — HEPARIN BOLUS VIA INFUSION
4000.0000 [IU] | Freq: Once | INTRAVENOUS | Status: AC
  Administered 2023-08-30: 4000 [IU] via INTRAVENOUS
  Filled 2023-08-30: qty 4000

## 2023-08-30 MED ORDER — DORZOLAMIDE HCL-TIMOLOL MAL 2-0.5 % OP SOLN
1.0000 [drp] | Freq: Two times a day (BID) | OPHTHALMIC | Status: DC
  Administered 2023-08-30 – 2023-09-02 (×4): 1 [drp] via OPHTHALMIC
  Filled 2023-08-30 (×2): qty 10

## 2023-08-30 MED ORDER — ONDANSETRON HCL 4 MG/2ML IJ SOLN
4.0000 mg | Freq: Four times a day (QID) | INTRAMUSCULAR | Status: DC | PRN
  Administered 2023-08-31: 4 mg via INTRAVENOUS
  Filled 2023-08-30: qty 2

## 2023-08-30 MED ORDER — ACETAMINOPHEN ER 650 MG PO TBCR: 650.0000 mg | EXTENDED_RELEASE_TABLET | Freq: Once | ORAL | Status: DC | PRN

## 2023-08-30 MED ORDER — NITROGLYCERIN 0.4 MG SL SUBL
0.4000 mg | SUBLINGUAL_TABLET | SUBLINGUAL | Status: DC | PRN
  Administered 2023-08-31: 0.4 mg via SUBLINGUAL
  Filled 2023-08-30: qty 1

## 2023-08-30 MED ORDER — NITROGLYCERIN 0.4 MG SL SUBL
0.4000 mg | SUBLINGUAL_TABLET | Freq: Once | SUBLINGUAL | Status: AC
  Administered 2023-08-30: 0.4 mg via SUBLINGUAL
  Filled 2023-08-30: qty 1

## 2023-08-30 MED ORDER — IOHEXOL 350 MG/ML SOLN
75.0000 mL | Freq: Once | INTRAVENOUS | Status: AC | PRN
  Administered 2023-08-30: 75 mL via INTRAVENOUS

## 2023-08-30 NOTE — Progress Notes (Signed)
PHARMACY - ANTICOAGULATION CONSULT NOTE  Pharmacy Consult for heparin  Indication: chest pain/ACS  Allergies  Allergen Reactions   Fluticasone-Salmeterol Itching    Patient Measurements:   Heparin Dosing Weight: 75.5kg   Vital Signs: Temp: 99.2 F (37.3 C) (10/19 1738) Temp Source: Oral (10/19 1738) BP: 120/59 (10/19 1738) Pulse Rate: 59 (10/19 1738)  Labs: Recent Labs    08/30/23 1742  HGB 12.6*  HCT 38.2*  PLT 174  CREATININE 0.92  TROPONINIHS 340*    CrCl cannot be calculated (Unknown ideal weight.).   Medical History: Past Medical History:  Diagnosis Date   Arthritis    Glaucoma    Hyperlipidemia    Hypothyroidism    Pre-diabetes    Assessment: Patient admitted with CC of chest pain, trop 340. HgB 12.6 and PLTs 174. Not on anticoagulation PTA. Pharmacy consulted to dose heparin.    Goal of Therapy:  Heparin level 0.3-0.7 units/ml Monitor platelets by anticoagulation protocol: Yes   Plan:  Give 4000 units bolus x 1 Start heparin infusion at 900 units/hr Check anti-Xa level in 8 hours and daily while on heparin Continue to monitor H&H and platelets  Estill Batten, PharmD, BCCCP  08/30/2023,7:08 PM

## 2023-08-30 NOTE — H&P (Signed)
Cardiology Admission History and Physical   Patient ID: Stephen Garcia MRN: [id number]; DOB: Mar 26, 1941   Admission date: 08/30/2023  PCP:  Tally Joe, MD   [facility] Providers Cardiologist:  None        Chief Complaint:  Chest pain  Patient Profile:   Stephen Garcia is a 82 y.o. male with a significant past medical history of hypothyroidism, pre-diabetes, and hypercholesteremia, who is being seen 08/30/2023 for the evaluation of chest pain.  History of Present Illness:   Stephen Garcia states at around  12 pm today while getting things ready to mow his lawn he suddenly noticed chest tightness at the center of his chest that was non-radiating.  He eventually developed nausea as well.  His wife called emergency medical services and his chest pain initially resolved, he decided not to come to the emergency department.  While trying to rest, his chest pain came back for which emergency medical services was called again.  Patient was sent to our hospital for further evaluation.  Per patient, he has never had chest pain before.  He was provided high-dose aspirin in route to our hospital.  In the emergency department ECG was without ischemia/infarct.  Initial HS-troponin was 340 ng/L and went to 908 ng/L.  Chest CT was performed and ruled out pulmonary embolism.  Patient was placed on heparin drip.   Past Medical History:  Diagnosis Date   Arthritis    Glaucoma    Hyperlipidemia    Hypothyroidism    Pre-diabetes     Past Surgical History:  Procedure Laterality Date   BACK SURGERY     CYSTOSCOPY WITH LITHOLAPAXY N/A 04/01/2023   Procedure: CYSTOSCOPY WITH LITHOLAPAXY;  Surgeon: Bjorn Pippin, MD;  Location: [facility];  Service: Urology;  Laterality: N/A;   EYE SURGERY     HOLMIUM LASER APPLICATION N/A 04/01/2023   Procedure: HOLMIUM LASER APPLICATION;  Surgeon: Bjorn Pippin, MD;  Location: [facility];  Service: Urology;  Laterality: N/A;   TONSILLECTOMY     TRANSURETHRAL RESECTION OF  PROSTATE N/A 04/01/2023   Procedure: TRANSURETHRAL RESECTION OF THE PROSTATE (TURP);  Surgeon: Bjorn Pippin, MD;  Location: [facility];  Service: Urology;  Laterality: N/A;     Medications Prior to Admission: Prior to Admission medications   Medication Sig Start Date End Date Taking? Authorizing Provider  acetaminophen (TYLENOL) 650 MG CR tablet Take 650 mg by mouth once as needed for pain.   Yes [provider]  atorvastatin (LIPITOR) 20 MG tablet Take 20 mg by mouth daily. 09/07/15  Yes [provider]  brimonidine (ALPHAGAN P) 0.1 % SOLN Place 1 drop into the left eye in the morning, at noon, and at bedtime. 03/17/17  Yes [provider]  dorzolamide-timolol (COSOPT) 2-0.5 % ophthalmic solution Place 1 drop into both eyes in the morning, at noon, and at bedtime.   Yes [provider]  ergocalciferol (VITAMIN D2) 50000 UNITS capsule Take 50,000 Units by mouth once a week.   Yes [provider]  levothyroxine (SYNTHROID) 112 MCG tablet Take 1 tablet (112 mcg total) by mouth daily. 02/13/23  Yes Reather Littler, MD  pilocarpine (PILOCAR) 1 % ophthalmic solution Place 1 drop into the left eye 4 (four) times daily.   Yes [provider]  ROCKLATAN 0.02-0.005 % SOLN Place 1 drop into both eyes at bedtime. 06/18/22  Yes [provider]     Allergies:    Allergies  Allergen Reactions   Fluticasone-Salmeterol  Itching    Social History:   Social History   Socioeconomic History   Marital status: Married    Spouse name: Not on file   Number of children: Not on file   Years of education: Not on file   Highest education level: Not on file  Occupational History   Occupation: Heritage manager: UNEMPLOYED    Comment: retired  Tobacco Use   Smoking status: Never   Smokeless tobacco: Never  Vaping Use   Vaping status: Never Used  Substance and Sexual Activity   Alcohol use: Yes    Alcohol/week: 0.0 standard drinks of alcohol     Comment: occassionally "once every blue moon"   Drug use: No   Sexual activity: Not on file  Other Topics Concern   Not on file  Social History Narrative   Right handed   Social Determinants of Health   Financial Resource Strain: Not on file  Food Insecurity: No Food Insecurity (08/30/2023)   Hunger Vital Sign    Worried About Running Out of Food in the Last Year: Never true    Ran Out of Food in the Last Year: Never true  Transportation Needs: No Transportation Needs (08/30/2023)   PRAPARE - Administrator, Civil Service (Medical): No    Lack of Transportation (Non-Medical): No  Physical Activity: Not on file  Stress: Not on file  Social Connections: Not on file  Intimate Partner Violence: Not At Risk (08/30/2023)   Humiliation, Afraid, Rape, and Kick questionnaire    Fear of Current or Ex-Partner: No    Emotionally Abused: No    Physically Abused: No    Sexually Abused: No    Family History:   The patient's family history includes Asthma in his sister; Dementia in his sister and sister; Heart disease in his mother.    ROS:  Please see the history of present illness.  All other ROS reviewed and negative.     Physical Exam/Data:   Vitals:   08/30/23 2000 08/30/23 2015 08/30/23 2030 08/30/23 2115  BP: (!) 115/57 112/60 124/68 128/64  Pulse: (!) 47 (!) 43 (!) 45 (!) 55  Resp: 14 12 12 14   Temp:      TempSrc:      SpO2: 98% 100% 99% 98%  Weight:      Height:       No intake or output data in the 24 hours ending 08/30/23 2154    08/30/2023    7:00 PM 07/01/2023    8:47 AM 05/14/2023   10:01 AM  Last 3 Weights  Weight (lbs) 173 lb 15.1 oz 174 lb 170 lb  Weight (kg) 78.9 kg 78.926 kg 77.111 kg     Body mass index is 29.86 kg/m.  General:  Well nourished, well developed, in no acute distress HEENT: normal Neck: no JVD Vascular: No carotid bruits; Distal pulses 2+ bilaterally   Cardiac:  normal S1, S2; RRR; no murmur  Lungs:  clear to auscultation  bilaterally, no wheezing, rhonchi or rales  Abd: soft, nontender, no hepatomegaly  Ext: no edema Musculoskeletal:  No deformities, BUE and BLE strength normal and equal Skin: warm and dry  Neuro:  CNs 2-12 intact, no focal abnormalities noted Psych:  Normal affect    EKG:  The ECG that was done  08/30/23 (17:24 hrs) was personally reviewed and demonstrates sinus bradycardia, normal ECG.  Relevant CV Studies: None  Laboratory Data:  High Sensitivity Troponin:  Recent Labs  Lab 08/30/23 1742 08/30/23 1928  TROPONINIHS 340* 908*      Chemistry Recent Labs  Lab 08/30/23 1742  NA 135  K 3.9  CL 106  CO2 21*  GLUCOSE 213*  BUN 14  CREATININE 0.92  CALCIUM 9.1  GFRNONAA >60  ANIONGAP 8    Recent Labs  Lab 08/30/23 1742  PROT 6.7  ALBUMIN 3.6  AST 22  ALT 16  ALKPHOS 38  BILITOT 0.7   Lipids No results for input(s): "CHOL", "TRIG", "HDL", "LABVLDL", "LDLCALC", "CHOLHDL" in the last 168 hours. Hematology Recent Labs  Lab 08/30/23 1742  WBC 9.2  RBC 4.25  HGB 12.6*  HCT 38.2*  MCV 89.9  MCH 29.6  MCHC 33.0  RDW 12.8  PLT 174   Thyroid No results for input(s): "TSH", "FREET4" in the last 168 hours. BNPNo results for input(s): "BNP", "PROBNP" in the last 168 hours.  DDimer  Recent Labs  Lab 08/30/23 1934  DDIMER 0.94*     Radiology/Studies:  DG Chest 2 View  Result Date: 08/30/2023 CLINICAL DATA:  Substernal chest pain, nausea, diaphoresis. EXAM: CHEST - 2 VIEW COMPARISON:  Chest CT without contrast 02/25/2023 FINDINGS: There is mild cardiomegaly. Mild aortic ectasia and atherosclerosis with stable mediastinum. The central vessels are mildly prominent, without overt interstitial edema. There are mild chronic interstitial changes in both bases without focal pneumonia or pleural effusion. Thoracic cage is intact with osteopenia. IMPRESSION: 1. Mild central vascular prominence without overt edema. No other evidence of acute chest process. 2. Mild  cardiomegaly and aortic atherosclerosis. Electronically Signed   By: Almira Bar M.D.   On: 08/30/2023 20:44     Assessment and Plan:   Chest pain: Patient with chest pain in the setting of a rising HS-troponin, consistent with NSTEMI-ACS.  Chest CT performed was negative for pulmonary embolism.  He is now chest pain free and hemodynamically stable.  ECG is without signs of ischemia/infarction.  Patient was placed on heparin drip and aspirin.   --Continue aspirin and heparin drip. --Arrange for coronary angiography on Monday if patient remains stable. --Check lipid panel and A1c. --Start atorvastatin 80 mg daily. --Arrange for an echocardiogram.    # Hypothyroidism: --Continue home levothyroxine.   Risk Assessment/Risk Scores:    TIMI Risk Score for Unstable Angina or Non-ST Elevation MI:   The patient's TIMI risk score is 3, which indicates a 13% risk of all cause mortality, new or recurrent myocardial infarction or need for urgent revascularization in the next 14 days.      Code Status: Full Code  Severity of Illness: The appropriate patient status for this patient is INPATIENT. Inpatient status is judged to be reasonable and necessary in order to provide the required intensity of service to ensure the patient's safety. The patient's presenting symptoms, physical exam findings, and initial radiographic and laboratory data in the context of their chronic comorbidities is felt to place them at high risk for further clinical deterioration. Furthermore, it is not anticipated that the patient will be medically stable for discharge from the hospital within 2 midnights of admission.   * I certify that at the point of admission it is my clinical judgment that the patient will require inpatient hospital care spanning beyond 2 midnights from the point of admission due to high intensity of service, high risk for further deterioration and high frequency of surveillance required.*   For  questions or updates, please contact [facility] Please consult [email or web address] for  contact info under     Signed, Judie Grieve, MD  08/30/2023 9:54 PM

## 2023-08-30 NOTE — ED Triage Notes (Signed)
Pt here from home with c/o chest pain , started earlier today but worse as the day went along , pt received 1 nitroglycerin and 324mg  asa from ems

## 2023-08-30 NOTE — ED Provider Triage Note (Addendum)
Emergency Medicine Provider Triage Evaluation Note  Stephen Garcia , a 82 y.o. male  was evaluated in triage.  Pt complains of substernal chest pain with pressure starting at 1130AM today. EMS saw patient, he refused to go to hospital. After EMS left he felt nauseated and diaphoretic and called EMS again. Was given nitroglycerin and had a little relief with chest pain. Pain is nonradiating and 3/4.  Received aspirin 324mg  by ems.  Review of Systems  Positive: Chest pain Negative: SOB  Physical Exam  BP (!) 120/59   Pulse (!) 59   Temp 99.2 F (37.3 C) (Oral)   Resp 18   SpO2 99%  Gen:   Awake, no distress   Resp:  Normal effort  MSK:   Moves extremities without difficulty    Medical Decision Making  Medically screening exam initiated at 5:47 PM.  Appropriate orders placed.  Stephen Garcia was informed that the remainder of the evaluation will be completed by another provider, this initial triage assessment does not replace that evaluation, and the importance of remaining in the ED until their evaluation is complete.     Pete Pelt, PA 08/30/23 1749    Pete Pelt, [place] 08/30/23 1749

## 2023-08-30 NOTE — ED Provider Notes (Signed)
[place] [facility] AT [facility] Provider Note   CSN: [id number] Arrival date & time: 08/30/23  1733     History  Chief Complaint  Patient presents with   Chest Pain    Stephen Garcia is a 82 y.o. male.  Patient here after episode of chest pain around noon today.  Got better with nitroglycerin.  History of hypertension.  No recent surgery or travel.  No history of blood clots.  Pain has resolved.  Nothing makes it worse or better.  He has not noticed any chest pain with activity here recently.  He denies any weakness numbness tingling.  No headaches.  No body aches.  No recent illness otherwise.  The history is provided by the patient.       Home Medications Prior to Admission medications   Medication Sig Start Date End Date Taking? Authorizing Provider  acetaminophen (TYLENOL) 650 MG CR tablet Take 650 mg by mouth once as needed for pain.   Yes [provider]  atorvastatin (LIPITOR) 20 MG tablet Take 20 mg by mouth daily. 09/07/15  Yes [provider]  brimonidine (ALPHAGAN P) 0.1 % SOLN Place 1 drop into the left eye in the morning, at noon, and at bedtime. 03/17/17  Yes [provider]  dorzolamide-timolol (COSOPT) 2-0.5 % ophthalmic solution Place 1 drop into both eyes in the morning, at noon, and at bedtime.   Yes [provider]  ergocalciferol (VITAMIN D2) 50000 UNITS capsule Take 50,000 Units by mouth once a week.   Yes [provider]  levothyroxine (SYNTHROID) 112 MCG tablet Take 1 tablet (112 mcg total) by mouth daily. 02/13/23  Yes Reather Littler, MD  pilocarpine (PILOCAR) 1 % ophthalmic solution Place 1 drop into the left eye 4 (four) times daily.   Yes [provider]  ROCKLATAN 0.02-0.005 % SOLN Place 1 drop into both eyes at bedtime. 06/18/22  Yes [provider]      Allergies    Fluticasone-salmeterol    Review of Systems   Review of Systems  Physical Exam Updated Vital Signs BP  (!) 112/57   Pulse 63   Temp 99 F (37.2 C)   Resp 15   Ht 5\' 4"  (1.626 m)   Wt 78.9 kg   SpO2 97%   BMI 29.86 kg/m  Physical Exam Vitals and nursing note reviewed.  Constitutional:      General: He is not in acute distress.    Appearance: He is well-developed. He is not ill-appearing.  HENT:     Head: Normocephalic and atraumatic.  Eyes:     Extraocular Movements: Extraocular movements intact.     Conjunctiva/sclera: Conjunctivae normal.     Pupils: Pupils are equal, round, and reactive to light.  Cardiovascular:     Rate and Rhythm: Normal rate and regular rhythm.     Pulses:          Radial pulses are 2+ on the right side and 2+ on the left side.     Heart sounds: Normal heart sounds. No murmur heard. Pulmonary:     Effort: Pulmonary effort is normal. No respiratory distress.     Breath sounds: Normal breath sounds. No decreased breath sounds or wheezing.  Abdominal:     Palpations: Abdomen is soft.     Tenderness: There is no abdominal tenderness.  Musculoskeletal:        General: No swelling. Normal range of motion.     Cervical back: Normal  range of motion and neck supple.     Right lower leg: No edema.     Left lower leg: No edema.  Skin:    General: Skin is warm and dry.     Capillary Refill: Capillary refill takes less than 2 seconds.  Neurological:     Mental Status: He is alert.  Psychiatric:        Mood and Affect: Mood normal.     ED Results / Procedures / Treatments   Labs (all labs ordered are listed, but only abnormal results are displayed) Labs Reviewed  BASIC METABOLIC PANEL - Abnormal; Notable for the following components:      Result Value   CO2 21 (*)    Glucose, Bld 213 (*)    All other components within normal limits  CBC - Abnormal; Notable for the following components:   Hemoglobin 12.6 (*)    HCT 38.2 (*)    All other components within normal limits  D-DIMER, QUANTITATIVE - Abnormal; Notable for the following components:    D-Dimer, Quant 0.94 (*)    All other components within normal limits  TROPONIN I (HIGH SENSITIVITY) - Abnormal; Notable for the following components:   Troponin I (High Sensitivity) 340 (*)    All other components within normal limits  TROPONIN I (HIGH SENSITIVITY) - Abnormal; Notable for the following components:   Troponin I (High Sensitivity) 908 (*)    All other components within normal limits  HEPATIC FUNCTION PANEL  CBC  LIPOPROTEIN A (LPA)  LIPID PANEL  BASIC METABOLIC PANEL  HEMOGLOBIN A1C    EKG EKG Interpretation Date/Time:  Saturday August 30 2023 17:24:27 EDT Ventricular Rate:  57 PR Interval:  146 QRS Duration:  106 QT Interval:  390 QTC Calculation: 379 R Axis:   6  Text Interpretation: Sinus bradycardia Nonspecific T wave abnormality Abnormal ECG When compared with ECG of 17-Dec-2022 13:23, PREVIOUS ECG IS PRESENT Confirmed by Virgina Norfolk [phone]) on 08/30/2023 6:57:41 PM  Radiology DG Chest 2 View  Result Date: 08/30/2023 CLINICAL DATA:  Substernal chest pain, nausea, diaphoresis. EXAM: CHEST - 2 VIEW COMPARISON:  Chest CT without contrast 02/25/2023 FINDINGS: There is mild cardiomegaly. Mild aortic ectasia and atherosclerosis with stable mediastinum. The central vessels are mildly prominent, without overt interstitial edema. There are mild chronic interstitial changes in both bases without focal pneumonia or pleural effusion. Thoracic cage is intact with osteopenia. IMPRESSION: 1. Mild central vascular prominence without overt edema. No other evidence of acute chest process. 2. Mild cardiomegaly and aortic atherosclerosis. Electronically Signed   By: Almira Bar M.D.   On: 08/30/2023 20:44    Procedures .Critical Care  Performed by: Virgina Norfolk, DO Authorized by: Virgina Norfolk, DO   Critical care provider statement:    Critical care time (minutes):  35   Critical care was necessary to treat or prevent imminent or life-threatening deterioration of  the following conditions:  Cardiac failure (NSTEMI)   Critical care was time spent personally by me on the following activities:  Blood draw for specimens, development of treatment plan with patient or surrogate, discussions with primary provider, obtaining history from patient or surrogate, ordering and performing treatments and interventions, ordering and review of laboratory studies, ordering and review of radiographic studies, pulse oximetry, re-evaluation of patient's condition and review of old charts   I assumed direction of critical care for this patient from another provider in my specialty: no       Medications Ordered in ED Medications  heparin ADULT infusion 100 units/mL (25000 units/264mL) (900 Units/hr Intravenous New Bag/Given 08/30/23 1932)  levothyroxine (SYNTHROID) tablet 112 mcg (has no administration in time range)  dorzolamide-timolol (COSOPT) 2-0.5 % ophthalmic solution 1 drop (1 drop Both Eyes Given 08/30/23 2301)  pilocarpine (PILOCAR) 1 % ophthalmic solution 1 drop (1 drop Left Eye Given 08/30/23 2301)  Netarsudil-Latanoprost 0.02-0.005 % SOLN 1 drop (has no administration in time range)  brimonidine (ALPHAGAN) 0.15 % ophthalmic solution 1 drop (1 drop Left Eye Given 08/30/23 2301)  aspirin EC tablet 81 mg (has no administration in time range)  nitroGLYCERIN (NITROSTAT) SL tablet 0.4 mg (has no administration in time range)  acetaminophen (TYLENOL) tablet 650 mg (has no administration in time range)  ondansetron (ZOFRAN) injection 4 mg (has no administration in time range)  atorvastatin (LIPITOR) tablet 40 mg (has no administration in time range)  nitroGLYCERIN (NITROSTAT) SL tablet 0.4 mg (0.4 mg Sublingual Given 08/30/23 1912)  heparin bolus via infusion 4,000 Units (4,000 Units Intravenous Bolus from Bag 08/30/23 1933)  iohexol (OMNIPAQUE) 350 MG/ML injection 75 mL (75 mLs Intravenous Contrast Given 08/30/23 2108)    ED Course/ Medical Decision Making/ A&P                                  Medical Decision Making Amount and/or Complexity of Data Reviewed Labs: ordered. Radiology: ordered.  Risk Prescription drug management. Decision regarding hospitalization.   Stephen Garcia is here with chest pain.  Normal vitals.  No fever.  EKG shows sinus bradycardia.  No ischemic changes.  Patient overall appears well.  Had an episode of chest pain at noon about 6 hours ago.  Resolved with aspirin and nitroglycerin.  Has a history of hypertension.  He is ready had blood work including CBC BMP and troponin.  Chest x-ray shows no evidence of pneumonia or pneumothorax per my review and interpretation.  Troponin is 340 per my review and interpretation.  Lab work otherwise unremarkable.  He has no PE risk factors.  But I have talked with cardiology.  We will start patient on IV heparin bolus and infusion for NSTEMI.  Will get a D-dimer to evaluate for PE.  Overall suspect NSTEMI.  Will rule out PE and admit to likely cardiology.  Overall per my review and interpretation of CT of the chest I do not see any large PE.  Awaiting radiology report per cardiology to admit for further care for NSTEMI.  Patient hemodynamically stable for my care.  This chart was dictated using voice recognition software.  Despite best efforts to proofread,  errors can occur which can change the documentation meaning.         Final Clinical Impression(s) / ED Diagnoses Final diagnoses:  NSTEMI (non-ST elevated myocardial infarction) [facility])    Rx / DC Orders ED Discharge Orders     None         Virgina Norfolk, DO 08/30/23 2304

## 2023-08-31 ENCOUNTER — Inpatient Hospital Stay (HOSPITAL_COMMUNITY): Payer: Medicare Other

## 2023-08-31 ENCOUNTER — Other Ambulatory Visit: Payer: Self-pay

## 2023-08-31 DIAGNOSIS — I214 Non-ST elevation (NSTEMI) myocardial infarction: Principal | ICD-10-CM

## 2023-08-31 DIAGNOSIS — I209 Angina pectoris, unspecified: Secondary | ICD-10-CM

## 2023-08-31 DIAGNOSIS — R079 Chest pain, unspecified: Secondary | ICD-10-CM | POA: Diagnosis not present

## 2023-08-31 LAB — ECHOCARDIOGRAM COMPLETE
AR max vel: 2.7 cm2
AV Area VTI: 2.72 cm2
AV Area mean vel: 2.38 cm2
AV Mean grad: 5 mm[Hg]
AV Peak grad: 8.8 mm[Hg]
Ao pk vel: 1.48 m/s
Area-P 1/2: 4.6 cm2
Height: 64 in
P 1/2 time: 877 ms
S' Lateral: 3 cm
Weight: 2783.09 [oz_av]

## 2023-08-31 LAB — BASIC METABOLIC PANEL
Anion gap: 9 (ref 5–15)
BUN: 12 mg/dL (ref 8–23)
CO2: 22 mmol/L (ref 22–32)
Calcium: 9.3 mg/dL (ref 8.9–10.3)
Chloride: 106 mmol/L (ref 98–111)
Creatinine, Ser: 0.88 mg/dL (ref 0.61–1.24)
GFR, Estimated: >60 mL/min (ref >60)
Glucose, Bld: 117 mg/dL — ABNORMAL HIGH (ref 70–99)
Potassium: 3.8 mmol/L (ref 3.5–5.1)
Sodium: 137 mmol/L (ref 135–145)

## 2023-08-31 LAB — CBC
HCT: 36.4 % — ABNORMAL LOW (ref 39.0–52.0)
Hemoglobin: 12.4 g/dL — ABNORMAL LOW (ref 13.0–17.0)
MCH: 30.2 pg (ref 26.0–34.0)
MCHC: 34.1 g/dL (ref 30.0–36.0)
MCV: 88.8 fL (ref 80.0–100.0)
Platelets: 159 10*3/uL (ref 150–400)
RBC: 4.1 MIL/uL — ABNORMAL LOW (ref 4.22–5.81)
RDW: 13 % (ref 11.5–15.5)
WBC: 10.3 10*3/uL (ref 4.0–10.5)
nRBC: 0 % (ref 0.0–0.2)

## 2023-08-31 LAB — LIPID PANEL
Cholesterol: 139 mg/dL (ref 0–200)
HDL: 53 mg/dL (ref >40)
LDL Cholesterol: 80 mg/dL (ref 0–99)
Total CHOL/HDL Ratio: 2.6 {ratio}
Triglycerides: 31 mg/dL (ref <150)
VLDL: 6 mg/dL (ref 0–40)

## 2023-08-31 LAB — HEPARIN LEVEL (UNFRACTIONATED)
Heparin Unfractionated: 0.65 [IU]/mL (ref 0.30–0.70)
Heparin Unfractionated: 0.73 [IU]/mL — ABNORMAL HIGH (ref 0.30–0.70)

## 2023-08-31 LAB — HEMOGLOBIN A1C
Hgb A1c MFr Bld: 6 % — ABNORMAL HIGH (ref 4.8–5.6)
Mean Plasma Glucose: 125.5 mg/dL

## 2023-08-31 MED ORDER — NITROGLYCERIN IN D5W 200-5 MCG/ML-% IV SOLN
0.0000 ug/min | INTRAVENOUS | Status: DC
  Administered 2023-08-31: 5 ug/min via INTRAVENOUS
  Filled 2023-08-31: qty 250

## 2023-08-31 NOTE — Progress Notes (Signed)
PHARMACY - ANTICOAGULATION CONSULT NOTE  Pharmacy Consult for heparin  Indication: chest pain/ACS  Allergies  Allergen Reactions   Fluticasone-Salmeterol Itching    Patient Measurements: Height: 5\' 4"  (162.6 cm) Weight: 78.9 kg (173 lb 15.1 oz) IBW/kg (Calculated) : 59.2 Heparin Dosing Weight: 75.5kg   Vital Signs: Temp: 98.4 F (36.9 C) (10/20 0331) Temp Source: Oral (10/20 0331) BP: 102/62 (10/20 0500) Pulse Rate: 45 (10/20 0500)  Labs: Recent Labs    08/30/23 1742 08/30/23 1928 08/31/23 0314 08/31/23 0538  HGB 12.6*  --  12.4*  --   HCT 38.2*  --  36.4*  --   PLT 174  --  159  --   HEPARINUNFRC  --   --   --  0.65  CREATININE 0.92  --  0.88  --   TROPONINIHS 340* 908*  --   --     Estimated Creatinine Clearance: 60.4 mL/min (by C-G formula based on SCr of 0.88 mg/dL).   Medical History: Past Medical History:  Diagnosis Date   Arthritis    Glaucoma    Hyperlipidemia    Hypothyroidism    Pre-diabetes    Assessment: Patient admitted with CC of chest pain, trop 340. HgB 12.6 and PLTs 174. Not on anticoagulation PTA. Pharmacy consulted to dose heparin.    10/20 AM update:  Heparin level therapeutic   Goal of Therapy:  Heparin level 0.3-0.7 units/ml Monitor platelets by anticoagulation protocol: Yes   Plan:  Cont heparin 900 units/hr 1200 heparin level  Abran Duke, PharmD, BCPS Clinical Pharmacist Phone: [phone]

## 2023-08-31 NOTE — ED Notes (Signed)
ED TO INPATIENT HANDOFF REPORT  ED Nurse Name and Phone #: Lowanda Foster [phone]  S Name/Age/Gender Stephen Garcia 82 y.o. male Room/Bed: 006C/006C  Code Status   Code Status: Full Code  Home/SNF/Other Home Patient oriented to: self, place, time, and situation Is this baseline? Yes   Triage Complete: Triage complete  Chief Complaint NSTEMI (non-ST elevated myocardial infarction) (HCC) [I21.4]  Triage Note Pt here from home with c/o chest pain , started earlier today but worse as the day went along , pt received 1 nitroglycerin and 324mg  asa from [facility]    Allergies Allergies  Allergen Reactions   Fluticasone-Salmeterol Itching    Level of Care/Admitting Diagnosis ED Disposition     ED Disposition  Admit   Condition  --   Comment  Hospital Area: [facility] [[id number]]  Level of Care: Telemetry Cardiac [[id number]]  May admit patient to [facility] or [facility] if equivalent level of care is available:: Yes  Covid Evaluation: Asymptomatic - no recent exposure (last 10 days) testing not required  Diagnosis: NSTEMI (non-ST elevated myocardial infarction) [facility]) [[id number]]  Admitting Physician: Arlester Marker [[id number]]  Attending Physician: Arlester Marker [[id number]]  Certification:: I certify this patient will need inpatient services for at least 2 midnights  Expected Medical Readiness: 09/01/2023          B Medical/Surgery History Past Medical History:  Diagnosis Date   Arthritis    Glaucoma    Hyperlipidemia    Hypothyroidism    Pre-diabetes    Past Surgical History:  Procedure Laterality Date   BACK SURGERY     CYSTOSCOPY WITH LITHOLAPAXY N/A 04/01/2023   Procedure: CYSTOSCOPY WITH LITHOLAPAXY;  Surgeon: Bjorn Pippin, MD;  Location: [facility];  Service: Urology;  Laterality: N/A;   EYE SURGERY     HOLMIUM LASER APPLICATION N/A 04/01/2023   Procedure: HOLMIUM LASER APPLICATION;  Surgeon: Bjorn Pippin, MD;  Location: [facility];  Service: Urology;   Laterality: N/A;   TONSILLECTOMY     TRANSURETHRAL RESECTION OF PROSTATE N/A 04/01/2023   Procedure: TRANSURETHRAL RESECTION OF THE PROSTATE (TURP);  Surgeon: Bjorn Pippin, MD;  Location: [facility];  Service: Urology;  Laterality: N/A;     A IV Location/Drains/Wounds Patient Lines/Drains/Airways Status     Active Line/Drains/Airways     Name Placement date Placement time Site Days   Peripheral IV 08/30/23 20 G Anterior;Left Hand 08/30/23  1744  Hand  1   Peripheral IV 08/30/23 20 G Right Antecubital 08/30/23  2001  Antecubital  1            Intake/Output Last 24 hours No intake or output data in the 24 hours ending 08/31/23 1337  Labs/Imaging Results for orders placed or performed during the hospital encounter of 08/30/23 (from the past 48 hour(s))  Basic metabolic panel     Status: Abnormal   Collection Time: 08/30/23  5:42 PM  Result Value Ref Range   Sodium 135 135 - 145 mmol/L   Potassium 3.9 3.5 - 5.1 mmol/L   Chloride 106 98 - 111 mmol/L   CO2 21 (L) 22 - 32 mmol/L   Glucose, Bld 213 (H) 70 - 99 mg/dL    Comment: Glucose reference range applies only to samples taken after fasting for at least 8 hours.   BUN 14 8 - 23 mg/dL   Creatinine, Ser 0.10 0.61 - 1.24 mg/dL   Calcium 9.1 8.9 - 27.2 mg/dL   GFR, Estimated >53 >66 mL/min  Comment: (NOTE) Calculated using the CKD-EPI Creatinine Equation (2021)    Anion gap 8 5 - 15    Comment: Performed at [facility], [address] [place], [place] [address]  CBC     Status: Abnormal   Collection Time: 08/30/23  5:42 PM  Result Value Ref Range   WBC 9.2 4.0 - 10.5 K/uL   RBC 4.25 4.22 - 5.81 MIL/uL   Hemoglobin 12.6 (L) 13.0 - 17.0 g/dL   HCT 57.8 (L) 46.9 - 62.9 %   MCV 89.9 80.0 - 100.0 fL   MCH 29.6 26.0 - 34.0 pg   MCHC 33.0 30.0 - 36.0 g/dL   RDW 52.8 41.3 - 24.4 %   Platelets 174 150 - 400 K/uL   nRBC 0.0 0.0 - 0.2 %    Comment: Performed at [facility], [address], [place], [place] [address]   Troponin I (High Sensitivity)     Status: Abnormal   Collection Time: 08/30/23  5:42 PM  Result Value Ref Range   Troponin I (High Sensitivity) 340 (HH) <18 ng/L    Comment: CRITICAL RESULT CALLED TO, READ BACK BY AND VERIFIED WITH C,GROSE RN @1836  08/30/23 E,BENTON (NOTE) Elevated high sensitivity troponin I (hsTnI) values and significant  changes across serial measurements may suggest ACS but many other  chronic and acute conditions are known to elevate hsTnI results.  Refer to the "Links" section for chest pain algorithms and additional  guidance. Performed at [facility], [address], [place], [place] [address]   Hepatic function panel     Status: None   Collection Time: 08/30/23  5:42 PM  Result Value Ref Range   Total Protein 6.7 6.5 - 8.1 g/dL   Albumin 3.6 3.5 - 5.0 g/dL   AST 22 15 - 41 U/L   ALT 16 0 - 44 U/L   Alkaline Phosphatase 38 38 - 126 U/L   Total Bilirubin 0.7 0.3 - 1.2 mg/dL   Bilirubin, Direct 0.1 0.0 - 0.2 mg/dL   Indirect Bilirubin 0.6 0.3 - 0.9 mg/dL    Comment: Performed at [facility], [address], [place], [place] [address]  Troponin I (High Sensitivity)     Status: Abnormal   Collection Time: 08/30/23  7:28 PM  Result Value Ref Range   Troponin I (High Sensitivity) 908 (HH) <18 ng/L    Comment: CRITICAL VALUE NOTED. VALUE IS CONSISTENT WITH PREVIOUSLY REPORTED/CALLED VALUE (NOTE) Elevated high sensitivity troponin I (hsTnI) values and significant  changes across serial measurements may suggest ACS but many other  chronic and acute conditions are known to elevate hsTnI results.  Refer to the "Links" section for chest pain algorithms and additional  guidance. Performed at [facility], [address], [place], [place] [address]   D-dimer, quantitative     Status: Abnormal   Collection Time: 08/30/23  7:34 PM  Result Value Ref Range   D-Dimer, Quant 0.94 (H) 0.00 - 0.50 ug/mL-FEU    Comment: (NOTE) At the manufacturer  cut-off value of 0.5 g/mL FEU, this assay has a negative predictive value of 95-100%.This assay is intended for use in conjunction with a clinical pretest probability (PTP) assessment model to exclude pulmonary embolism (PE) and deep venous thrombosis (DVT) in outpatients suspected of PE or DVT. Results should be correlated with clinical presentation. Performed at [facility], [address] [place], [place] [address]   CBC  Status: Abnormal   Collection Time: 08/31/23  3:14 AM  Result Value Ref Range   WBC 10.3 4.0 - 10.5 K/uL   RBC 4.10 (L) 4.22 - 5.81 MIL/uL   Hemoglobin 12.4 (L) 13.0 - 17.0 g/dL   HCT 16.1 (L) 09.6 - 04.5 %   MCV 88.8 80.0 - 100.0 fL   MCH 30.2 26.0 - 34.0 pg   MCHC 34.1 30.0 - 36.0 g/dL   RDW 40.9 81.1 - 91.4 %   Platelets 159 150 - 400 K/uL   nRBC 0.0 0.0 - 0.2 %    Comment: Performed at [facility], [address] [place], [place] [address]  Basic metabolic panel     Status: Abnormal   Collection Time: 08/31/23  3:14 AM  Result Value Ref Range   Sodium 137 135 - 145 mmol/L   Potassium 3.8 3.5 - 5.1 mmol/L   Chloride 106 98 - 111 mmol/L   CO2 22 22 - 32 mmol/L   Glucose, Bld 117 (H) 70 - 99 mg/dL    Comment: Glucose reference range applies only to samples taken after fasting for at least 8 hours.   BUN 12 8 - 23 mg/dL   Creatinine, Ser 6.21 0.61 - 1.24 mg/dL   Calcium 9.3 8.9 - 30.8 mg/dL   GFR, Estimated >65 >78 mL/min    Comment: (NOTE) Calculated using the CKD-EPI Creatinine Equation (2021)    Anion gap 9 5 - 15    Comment: Performed at [facility], [address], [place], [place] [address]  Hemoglobin A1c     Status: Abnormal   Collection Time: 08/31/23  3:14 AM  Result Value Ref Range   Hgb A1c MFr Bld 6.0 (H) 4.8 - 5.6 %    Comment: (NOTE) Pre diabetes:          5.7%-6.4%  Diabetes:              >6.4%  Glycemic control for   <7.0% adults with diabetes    Mean Plasma Glucose 125.5 mg/dL    Comment: Performed  at [facility], [address] [place], [place] [address]  Lipid panel     Status: None   Collection Time: 08/31/23  3:17 AM  Result Value Ref Range   Cholesterol 139 0 - 200 mg/dL   Triglycerides 31 <132 mg/dL   HDL 53 >44 mg/dL   Total CHOL/HDL Ratio 2.6 RATIO   VLDL 6 0 - 40 mg/dL   LDL Cholesterol 80 0 - 99 mg/dL    Comment:        Total Cholesterol/HDL:CHD Risk Coronary Heart Disease Risk Table                     Men   Women  1/2 Average Risk   3.4   3.3  Average Risk       5.0   4.4  2 X Average Risk   9.6   7.1  3 X Average Risk  23.4   11.0        Use the calculated Patient Ratio above and the CHD Risk Table to determine the patient's CHD Risk.        ATP III CLASSIFICATION (LDL):  <100     mg/dL   Optimal  010-272  mg/dL   Near or Above                    Optimal  130-159  mg/dL   Borderline  643-329  mg/dL   High  >518     mg/dL   Very High Performed at [facility], [address] [place], [place] [address]   Heparin level (unfractionated)     Status: None   Collection Time: 08/31/23  5:38 AM  Result Value Ref Range   Heparin Unfractionated 0.65 0.30 - 0.70 IU/mL    Comment: (NOTE) The clinical reportable range upper limit is being lowered to >1.10 to align with the FDA approved guidance for the current laboratory assay.  If heparin results are below expected values, and patient dosage has  been confirmed, suggest follow up testing of antithrombin III levels. Performed at [facility], [address], [place], [place] [address]    ECHOCARDIOGRAM COMPLETE  Result Date: 08/31/2023    ECHOCARDIOGRAM REPORT   Patient Name:   KEMAL ORMS Date of Exam: 08/31/2023 Medical Rec #:  [id number]   Height:       64.0 in Accession #:    [id number]  Weight:       173.9 lb Date of Birth:  [birth year]    BSA:          1.844 m Patient Age:    82 years    BP:           108/53 mmHg Patient Gender: M           HR:           50 bpm. Exam Location:  Inpatient  Procedure: 2D Echo, Cardiac Doppler and Color Doppler Indications:    Chest Pain  History:        Patient has no prior history of Echocardiogram examinations.  Sonographer:    Darlys Gales Referring Phys: [id number] KAMAL H HENDERSON IMPRESSIONS  1. Left ventricular ejection fraction, by estimation, is 70 to 75%. The left ventricle has hyperdynamic function. The left ventricle has no regional wall motion abnormalities. Left ventricular diastolic parameters were normal.  2. Right ventricular systolic function is normal. The right ventricular size is normal. There is normal pulmonary artery systolic pressure.  3. The mitral valve is normal in structure. Trivial mitral valve regurgitation. No evidence of mitral stenosis.  4. The aortic valve is tricuspid. There is mild calcification of the aortic valve. Aortic valve regurgitation is mild to moderate. No aortic stenosis is present.  5. Aortic dilatation noted. There is borderline dilatation of the ascending aorta, measuring 37 mm.  6. The inferior vena cava is normal in size with greater than 50% respiratory variability, suggesting right atrial pressure of 3 mmHg. Comparison(s): No prior Echocardiogram. FINDINGS  Left Ventricle: Left ventricular ejection fraction, by estimation, is 70 to 75%. The left ventricle has hyperdynamic function. The left ventricle has no regional wall motion abnormalities. The left ventricular internal cavity size was normal in size. There is no left ventricular hypertrophy. Left ventricular diastolic parameters were normal. Right Ventricle: The right ventricular size is normal. No increase in right ventricular wall thickness. Right ventricular systolic function is normal. There is normal pulmonary artery systolic pressure. The tricuspid regurgitant velocity is 2.29 m/s, and  with an assumed right atrial pressure of 3 mmHg, the estimated right ventricular systolic pressure is 24.0 mmHg. Left Atrium: Left atrial size was normal in size. Right  Atrium: Right atrial size was normal in size. Pericardium: There is no evidence of pericardial effusion. Mitral Valve: The mitral valve is normal in structure. Trivial mitral valve regurgitation.  No evidence of mitral valve stenosis. Tricuspid Valve: The tricuspid valve is normal in structure. Tricuspid valve regurgitation is mild . No evidence of tricuspid stenosis. Aortic Valve: The aortic valve is tricuspid. There is mild calcification of the aortic valve. Aortic valve regurgitation is mild to moderate. Aortic regurgitation PHT measures 877 msec. No aortic stenosis is present. Aortic valve mean gradient measures 5.0 mmHg. Aortic valve peak gradient measures 8.8 mmHg. Aortic valve area, by VTI measures 2.72 cm. Pulmonic Valve: The pulmonic valve was normal in structure. Pulmonic valve regurgitation is trivial. No evidence of pulmonic stenosis. Aorta: The aortic root is normal in size and structure and aortic dilatation noted. There is borderline dilatation of the ascending aorta, measuring 37 mm. Venous: The inferior vena cava is normal in size with greater than 50% respiratory variability, suggesting right atrial pressure of 3 mmHg. IAS/Shunts: No atrial level shunt detected by color flow Doppler.  LEFT VENTRICLE PLAX 2D LVIDd:         4.60 cm   Diastology LVIDs:         3.00 cm   LV e' medial:    6.64 cm/s LV PW:         1.10 cm   LV E/e' medial:  12.5 LV IVS:        1.00 cm   LV e' lateral:   8.49 cm/s LVOT diam:     1.90 cm   LV E/e' lateral: 9.8 LV SV:         86 LV SV Index:   47 LVOT Area:     2.84 cm  RIGHT VENTRICLE RV S prime:     12.30 cm/s TAPSE (M-mode): 2.9 cm LEFT ATRIUM             Index        RIGHT ATRIUM           Index LA Vol (A2C):   39.9 ml 21.64 ml/m  RA Area:     13.30 cm LA Vol (A4C):   37.9 ml 20.56 ml/m  RA Volume:   32.20 ml  17.47 ml/m LA Biplane Vol: 39.6 ml 21.48 ml/m  AORTIC VALVE AV Area (Vmax):    2.70 cm AV Area (Vmean):   2.38 cm AV Area (VTI):     2.72 cm AV Vmax:            148.00 cm/s AV Vmean:          97.700 cm/s AV VTI:            0.316 m AV Peak Grad:      8.8 mmHg AV Mean Grad:      5.0 mmHg LVOT Vmax:         141.00 cm/s LVOT Vmean:        82.000 cm/s LVOT VTI:          0.303 m LVOT/AV VTI ratio: 0.96 AI PHT:            877 msec  AORTA Ao Root diam: 3.30 cm Ao Asc diam:  3.70 cm MITRAL VALVE               TRICUSPID VALVE MV Area (PHT): 4.60 cm    TR Peak grad:   21.0 mmHg MV Decel Time: 165 msec    TR Vmax:        229.00 cm/s MV E velocity: 83.00 cm/s MV A velocity: 76.70 cm/s  SHUNTS MV E/A ratio:  1.08  Systemic VTI:  0.30 m                            Systemic Diam: 1.90 cm Vishnu Priya Mallipeddi Electronically signed by Winfield Rast Mallipeddi Signature Date/Time: 08/31/2023/12:17:52 PM    Final    CT Angio Chest PE W and/or Wo Contrast  Result Date: 08/30/2023 CLINICAL DATA:  Chest pain and shortness of breath EXAM: CT ANGIOGRAPHY CHEST WITH CONTRAST TECHNIQUE: Multidetector CT imaging of the chest was performed using the standard protocol during bolus administration of intravenous contrast. Multiplanar CT image reconstructions and MIPs were obtained to evaluate the vascular anatomy. RADIATION DOSE REDUCTION: This exam was performed according to the departmental dose-optimization program which includes automated exposure control, adjustment of the mA and/or kV according to patient size and/or use of iterative reconstruction technique. CONTRAST:  75mL OMNIPAQUE IOHEXOL 350 MG/ML SOLN COMPARISON:  Chest x-ray from earlier in the same day, 02/25/2023, 12/17/2022. FINDINGS: Cardiovascular: Atherosclerotic calcifications of the thoracic aorta are noted. No aneurysmal dilatation is seen. No cardiac enlargement is noted. Pulmonary artery is well visualized within normal branching pattern. No filling defect to suggest pulmonary embolism is noted. Coronary calcifications are seen. Mediastinum/Nodes: Thoracic inlet is within normal limits. The esophagus as  visualized is within normal limits. No hilar or mediastinal adenopathy is seen. Lungs/Pleura: Lungs are well aerated bilaterally. Mild emphysematous changes are noted. Nodularity is noted in the left lung base similar to that seen on the prior exam. There is slight increase in size up to 1.6 cm in the transverse imaging. This may simply represent persistent scarring. No other focal nodularity is noted. Upper Abdomen: No acute abnormality. Musculoskeletal: No acute bony abnormality is noted. Review of the MIP images confirms the above findings. IMPRESSION: No evidence of pulmonary emboli. Slight increase in the size of the nodular area in the left lateral lower lobe when compared with the prior exam. Follow-up in 3-6 months is recommended to assess for stability. Aortic Atherosclerosis (ICD10-I70.0) and Emphysema (ICD10-J43.9). Electronically Signed   By: Alcide Clever M.D.   On: 08/30/2023 23:09   DG Chest 2 View  Result Date: 08/30/2023 CLINICAL DATA:  Substernal chest pain, nausea, diaphoresis. EXAM: CHEST - 2 VIEW COMPARISON:  Chest CT without contrast 02/25/2023 FINDINGS: There is mild cardiomegaly. Mild aortic ectasia and atherosclerosis with stable mediastinum. The central vessels are mildly prominent, without overt interstitial edema. There are mild chronic interstitial changes in both bases without focal pneumonia or pleural effusion. Thoracic cage is intact with osteopenia. IMPRESSION: 1. Mild central vascular prominence without overt edema. No other evidence of acute chest process. 2. Mild cardiomegaly and aortic atherosclerosis. Electronically Signed   By: Almira Bar M.D.   On: 08/30/2023 20:44    Pending Labs Unresulted Labs (From admission, onward)     Start     Ordered   09/01/23 0500  Heparin level (unfractionated)  Daily,   R      08/30/23 1918   08/31/23 1200  Heparin level (unfractionated)  Once-Timed,   TIMED        08/31/23 0605   08/31/23 0500  CBC  Daily,   R      08/30/23  1918   08/31/23 0500  Lipoprotein A (LPA)  Tomorrow morning,   R        08/30/23 2202            Vitals/Pain Today's Vitals   08/31/23 1154 08/31/23 1154 08/31/23  1300 08/31/23 1330  BP:  113/69 129/71 (!) 105/52  Pulse:  (!) 49 (!) 49 (!) 48  Resp:  (!) 21 14 20   Temp:  (!) 97.5 F (36.4 C)    TempSrc:  Oral    SpO2:  99% 99% 98%  Weight:      Height:      PainSc: 6        Isolation Precautions No active isolations  Medications Medications  heparin ADULT infusion 100 units/mL (25000 units/254mL) (900 Units/hr Intravenous Rate/Dose Verify 08/31/23 1336)  levothyroxine (SYNTHROID) tablet 112 mcg (112 mcg Oral Given 08/31/23 0746)  dorzolamide-timolol (COSOPT) 2-0.5 % ophthalmic solution 1 drop (1 drop Both Eyes Not Given 08/31/23 0912)  pilocarpine (PILOCAR) 1 % ophthalmic solution 1 drop (1 drop Left Eye Not Given 08/31/23 0940)  Netarsudil-Latanoprost 0.02-0.005 % SOLN 1 drop (1 drop Both Eyes Not Given 08/31/23 0321)  brimonidine (ALPHAGAN) 0.15 % ophthalmic solution 1 drop (1 drop Left Eye Not Given 08/31/23 0911)  aspirin EC tablet 81 mg (81 mg Oral Not Given 08/31/23 0911)  nitroGLYCERIN (NITROSTAT) SL tablet 0.4 mg (0.4 mg Sublingual Given 08/31/23 0352)  acetaminophen (TYLENOL) tablet 650 mg (has no administration in time range)  ondansetron (ZOFRAN) injection 4 mg (has no administration in time range)  atorvastatin (LIPITOR) tablet 40 mg (40 mg Oral Not Given 08/31/23 0911)  nitroGLYCERIN 50 mg in dextrose 5 % 250 mL (0.2 mg/mL) infusion (10 mcg/min Intravenous Rate/Dose Change 08/31/23 1155)  nitroGLYCERIN (NITROSTAT) SL tablet 0.4 mg (0.4 mg Sublingual Given 08/30/23 1912)  heparin bolus via infusion 4,000 Units (4,000 Units Intravenous Bolus from Bag 08/30/23 1933)  iohexol (OMNIPAQUE) 350 MG/ML injection 75 mL (75 mLs Intravenous Contrast Given 08/30/23 2108)    Mobility walks     Focused Assessments Cardiac Assessment Handoff:  Cardiac Rhythm: Sinus  bradycardia No results found for: "CKTOTAL", "CKMB", "CKMBINDEX", "TROPONINI" Lab Results  Component Value Date   DDIMER 0.94 (H) 08/30/2023   Does the Patient currently have chest pain? Yes    R Recommendations: See Admitting Provider Note  Report given to:   Additional Notes:

## 2023-08-31 NOTE — Progress Notes (Signed)
PHARMACY - ANTICOAGULATION CONSULT NOTE  Pharmacy Consult for heparin  Indication: chest pain/ACS  Allergies  Allergen Reactions   Fluticasone-Salmeterol Itching    Patient Measurements: Height: 5\' 4"  (162.6 cm) Weight: 78.9 kg (173 lb 15.1 oz) IBW/kg (Calculated) : 59.2 Heparin Dosing Weight: 75.5kg   Vital Signs: Temp: 97.5 F (36.4 C) (10/20 1154) Temp Source: Oral (10/20 1154) BP: 105/52 (10/20 1330) Pulse Rate: 48 (10/20 1330)  Labs: Recent Labs    08/30/23 1742 08/30/23 1928 08/31/23 0314 08/31/23 0538 08/31/23 1330  HGB 12.6*  --  12.4*  --   --   HCT 38.2*  --  36.4*  --   --   PLT 174  --  159  --   --   HEPARINUNFRC  --   --   --  0.65 0.73*  CREATININE 0.92  --  0.88  --   --   TROPONINIHS 340* 908*  --   --   --     Estimated Creatinine Clearance: 60.4 mL/min (by C-G formula based on SCr of 0.88 mg/dL).   Medical History: Past Medical History:  Diagnosis Date   Arthritis    Glaucoma    Hyperlipidemia    Hypothyroidism    Pre-diabetes    Assessment: Patient admitted with CC of chest pain, trop 340. HgB 12.6 and PLTs 174. Not on anticoagulation PTA. Pharmacy consulted to dose heparin.    Heparin level slightly supratherapeutic at 0.73 while on heparin 900u/hr. CBC remains stable.   Goal of Therapy:  Heparin level 0.3-0.7 units/ml Monitor platelets by anticoagulation protocol: Yes   Plan:  Reduce heprain to 850u/hr.  Recheck 8 hour heparin level.  Monitor for s/sx of bleeding.   Estill Batten, PharmD, BCCCP  Clinical Pharmacist Phone: [phone]

## 2023-08-31 NOTE — Progress Notes (Signed)
Rounding Note    Patient Name: Stephen Garcia Date of Encounter: 08/31/2023  [facility] Cardiologist: None   Subjective   Currently having chest discomfort.  No other complaints.  Inpatient Medications    Scheduled Meds:  aspirin EC  81 mg Oral Daily   atorvastatin  40 mg Oral Daily   brimonidine  1 drop Left Eye TID   dorzolamide-timolol  1 drop Both Eyes BID   levothyroxine  112 mcg Oral Daily   Netarsudil-Latanoprost  1 drop Both Eyes QHS   pilocarpine  1 drop Left Eye QID   Continuous Infusions:  heparin 900 Units/hr (08/31/23 0533)   PRN Meds: acetaminophen, nitroGLYCERIN, ondansetron (ZOFRAN) IV   Vital Signs    Vitals:   08/31/23 0741 08/31/23 0830 08/31/23 0900 08/31/23 0930  BP:  119/61 (!) 123/47 (!) 108/53  Pulse:  (!) 50 (!) 58 (!) 51  Resp:  (!) 22 15 19   Temp: 98.1 F (36.7 C)     TempSrc: Oral     SpO2:  100% 100% 98%  Weight:      Height:       No intake or output data in the 24 hours ending 08/31/23 1035    08/30/2023    7:00 PM 07/01/2023    8:47 AM 05/14/2023   10:01 AM  Last 3 Weights  Weight (lbs) 173 lb 15.1 oz 174 lb 170 lb  Weight (kg) 78.9 kg 78.926 kg 77.111 kg      Telemetry    Sinus rhythm- Personally Reviewed  ECG    Sinus rhythm with T wave flattening- Personally Reviewed  Physical Exam   GEN: No acute distress.   Neck: No JVD Cardiac: RRR, no murmurs, rubs, or gallops.  Respiratory: Clear to auscultation bilaterally. GI: Soft, nontender, non-distended  MS: No edema; No deformity. Neuro:  Nonfocal  Psych: Normal affect   Labs    High Sensitivity Troponin:   Recent Labs  Lab 08/30/23 1742 08/30/23 1928  TROPONINIHS 340* 908*     Chemistry Recent Labs  Lab 08/30/23 1742 08/31/23 0314  NA 135 137  K 3.9 3.8  CL 106 106  CO2 21* 22  GLUCOSE 213* 117*  BUN 14 12  CREATININE 0.92 0.88  CALCIUM 9.1 9.3  PROT 6.7  --   ALBUMIN 3.6  --   AST 22  --   ALT 16  --   ALKPHOS 38  --   BILITOT  0.7  --   GFRNONAA >60 >60  ANIONGAP 8 9    Lipids  Recent Labs  Lab 08/31/23 0317  CHOL 139  TRIG 31  HDL 53  LDLCALC 80  CHOLHDL 2.6    Hematology Recent Labs  Lab 08/30/23 1742 08/31/23 0314  WBC 9.2 10.3  RBC 4.25 4.10*  HGB 12.6* 12.4*  HCT 38.2* 36.4*  MCV 89.9 88.8  MCH 29.6 30.2  MCHC 33.0 34.1  RDW 12.8 13.0  PLT 174 159   Thyroid No results for input(s): "TSH", "FREET4" in the last 168 hours.  BNPNo results for input(s): "BNP", "PROBNP" in the last 168 hours.  DDimer  Recent Labs  Lab 08/30/23 1934  DDIMER 0.94*     Radiology    CT Angio Chest PE W and/or Wo Contrast  Result Date: 08/30/2023 CLINICAL DATA:  Chest pain and shortness of breath EXAM: CT ANGIOGRAPHY CHEST WITH CONTRAST TECHNIQUE: Multidetector CT imaging of the chest was performed using the standard protocol during bolus administration  of intravenous contrast. Multiplanar CT image reconstructions and MIPs were obtained to evaluate the vascular anatomy. RADIATION DOSE REDUCTION: This exam was performed according to the departmental dose-optimization program which includes automated exposure control, adjustment of the mA and/or kV according to patient size and/or use of iterative reconstruction technique. CONTRAST:  75mL OMNIPAQUE IOHEXOL 350 MG/ML SOLN COMPARISON:  Chest x-ray from earlier in the same day, 02/25/2023, 12/17/2022. FINDINGS: Cardiovascular: Atherosclerotic calcifications of the thoracic aorta are noted. No aneurysmal dilatation is seen. No cardiac enlargement is noted. Pulmonary artery is well visualized within normal branching pattern. No filling defect to suggest pulmonary embolism is noted. Coronary calcifications are seen. Mediastinum/Nodes: Thoracic inlet is within normal limits. The esophagus as visualized is within normal limits. No hilar or mediastinal adenopathy is seen. Lungs/Pleura: Lungs are well aerated bilaterally. Mild emphysematous changes are noted. Nodularity is noted  in the left lung base similar to that seen on the prior exam. There is slight increase in size up to 1.6 cm in the transverse imaging. This may simply represent persistent scarring. No other focal nodularity is noted. Upper Abdomen: No acute abnormality. Musculoskeletal: No acute bony abnormality is noted. Review of the MIP images confirms the above findings. IMPRESSION: No evidence of pulmonary emboli. Slight increase in the size of the nodular area in the left lateral lower lobe when compared with the prior exam. Follow-up in 3-6 months is recommended to assess for stability. Aortic Atherosclerosis (ICD10-I70.0) and Emphysema (ICD10-J43.9). Electronically Signed   By: Alcide Clever M.D.   On: 08/30/2023 23:09   DG Chest 2 View  Result Date: 08/30/2023 CLINICAL DATA:  Substernal chest pain, nausea, diaphoresis. EXAM: CHEST - 2 VIEW COMPARISON:  Chest CT without contrast 02/25/2023 FINDINGS: There is mild cardiomegaly. Mild aortic ectasia and atherosclerosis with stable mediastinum. The central vessels are mildly prominent, without overt interstitial edema. There are mild chronic interstitial changes in both bases without focal pneumonia or pleural effusion. Thoracic cage is intact with osteopenia. IMPRESSION: 1. Mild central vascular prominence without overt edema. No other evidence of acute chest process. 2. Mild cardiomegaly and aortic atherosclerosis. Electronically Signed   By: Almira Bar M.D.   On: 08/30/2023 20:44    Cardiac Studies   Echo pending  Patient Profile     82 y.o. male presented to the emergency room with chest pain, found to have elevated troponin consistent with non-STEMI.  Assessment & Plan    1.  Non-STEMI: Currently on aspirin and heparin.  Patient continuing to have chest pain.  Jeaneane Adamec start IV nitroglycerin.  Plan for left heart catheterization tomorrow.  The patient understands that risks include but are not limited to stroke (1 in 1000), death (1 in 1000), kidney  failure [usually temporary] (1 in 500), bleeding (1 in 200), allergic reaction [possibly serious] (1 in 200), and agrees to proceed.  2.  Hypothyroidism: Continue home Synthroid  3.  Hyperlipidemia: Start atorvastatin 80 mg     For questions or updates, please contact [facility] Please consult [email or web address] for contact info under        Signed, Jasha Hodzic Jorja Loa, MD  08/31/2023, 10:35 AM

## 2023-08-31 NOTE — ED Notes (Signed)
Paged MD due to increased chest pain and tightness. EKG done and NTG given per MD

## 2023-09-01 ENCOUNTER — Other Ambulatory Visit (HOSPITAL_COMMUNITY): Payer: Self-pay

## 2023-09-01 ENCOUNTER — Encounter (HOSPITAL_COMMUNITY)
Admission: EM | Disposition: A | Discharge status: Home / Self Care | Payer: Self-pay | Source: Home / Self Care | Attending: Internal Medicine

## 2023-09-01 DIAGNOSIS — I2510 Atherosclerotic heart disease of native coronary artery without angina pectoris: Secondary | ICD-10-CM | POA: Diagnosis not present

## 2023-09-01 DIAGNOSIS — I214 Non-ST elevation (NSTEMI) myocardial infarction: Secondary | ICD-10-CM | POA: Diagnosis not present

## 2023-09-01 DIAGNOSIS — I209 Angina pectoris, unspecified: Secondary | ICD-10-CM | POA: Diagnosis not present

## 2023-09-01 DIAGNOSIS — E039 Hypothyroidism, unspecified: Secondary | ICD-10-CM | POA: Diagnosis not present

## 2023-09-01 HISTORY — PX: CORONARY STENT INTERVENTION: CATH118234

## 2023-09-01 HISTORY — PX: LEFT HEART CATH AND CORONARY ANGIOGRAPHY: CATH118249

## 2023-09-01 LAB — CBC
HCT: 35 % — ABNORMAL LOW (ref 39.0–52.0)
Hemoglobin: 11.8 g/dL — ABNORMAL LOW (ref 13.0–17.0)
MCH: 30.3 pg (ref 26.0–34.0)
MCHC: 33.7 g/dL (ref 30.0–36.0)
MCV: 89.7 fL (ref 80.0–100.0)
Platelets: 140 10*3/uL — ABNORMAL LOW (ref 150–400)
RBC: 3.9 MIL/uL — ABNORMAL LOW (ref 4.22–5.81)
RDW: 13.2 % (ref 11.5–15.5)
WBC: 6.7 10*3/uL (ref 4.0–10.5)
nRBC: 0 % (ref 0.0–0.2)

## 2023-09-01 LAB — HEPARIN LEVEL (UNFRACTIONATED): Heparin Unfractionated: 0.51 [IU]/mL (ref 0.30–0.70)

## 2023-09-01 LAB — POCT ACTIVATED CLOTTING TIME
Activated Clotting Time: 250 s
Activated Clotting Time: 293 s
Activated Clotting Time: 312 s

## 2023-09-01 SURGERY — LEFT HEART CATH AND CORONARY ANGIOGRAPHY
Anesthesia: LOCAL

## 2023-09-01 MED ORDER — FENTANYL CITRATE (PF) 100 MCG/2ML IJ SOLN
INTRAMUSCULAR | Status: AC
  Filled 2023-09-01: qty 2

## 2023-09-01 MED ORDER — HEPARIN SODIUM (PORCINE) 1000 UNIT/ML IJ SOLN
INTRAMUSCULAR | Status: AC
  Filled 2023-09-01: qty 10

## 2023-09-01 MED ORDER — SODIUM CHLORIDE 0.9 % IV SOLN
INTRAVENOUS | Status: AC | PRN
  Administered 2023-09-01: 250 mL via INTRAVENOUS

## 2023-09-01 MED ORDER — HEPARIN (PORCINE) IN NACL 2000-0.9 UNIT/L-% IV SOLN
INTRAVENOUS | Status: DC | PRN
  Administered 2023-09-01: 1000 mL

## 2023-09-01 MED ORDER — IOHEXOL 350 MG/ML SOLN
INTRAVENOUS | Status: DC | PRN
  Administered 2023-09-01: 116 mL

## 2023-09-01 MED ORDER — LIDOCAINE HCL (PF) 1 % IJ SOLN
INTRAMUSCULAR | Status: AC
  Filled 2023-09-01: qty 30

## 2023-09-01 MED ORDER — NITROGLYCERIN 1 MG/10 ML FOR IR/CATH LAB
INTRA_ARTERIAL | Status: DC | PRN
  Administered 2023-09-01 (×3): 100 ug via INTRACORONARY

## 2023-09-01 MED ORDER — VERAPAMIL HCL 2.5 MG/ML IV SOLN
INTRAVENOUS | Status: AC
  Filled 2023-09-01: qty 2

## 2023-09-01 MED ORDER — TICAGRELOR 90 MG PO TABS
90.0000 mg | ORAL_TABLET | Freq: Two times a day (BID) | ORAL | Status: DC
  Administered 2023-09-01 – 2023-09-02 (×2): 90 mg via ORAL
  Filled 2023-09-01 (×2): qty 1

## 2023-09-01 MED ORDER — SODIUM CHLORIDE 0.9% FLUSH: 3.0000 mL | INTRAVENOUS | Status: DC | PRN

## 2023-09-01 MED ORDER — ENOXAPARIN SODIUM 40 MG/0.4ML IJ SOSY: 40.0000 mg | PREFILLED_SYRINGE | INTRAMUSCULAR | Status: DC

## 2023-09-01 MED ORDER — SODIUM CHLORIDE 0.9% FLUSH: 10.0000 mL | Freq: Two times a day (BID) | INTRAVENOUS | Status: DC

## 2023-09-01 MED ORDER — CANGRELOR BOLUS VIA INFUSION
INTRAVENOUS | Status: DC | PRN
  Administered 2023-09-01: 2322 ug via INTRAVENOUS

## 2023-09-01 MED ORDER — SODIUM CHLORIDE 0.9% FLUSH
3.0000 mL | Freq: Two times a day (BID) | INTRAVENOUS | Status: DC
Start: 2023-09-01 — End: 2023-09-02
  Administered 2023-09-01 – 2023-09-02 (×2): 3 mL via INTRAVENOUS

## 2023-09-01 MED ORDER — ONDANSETRON HCL 4 MG/2ML IJ SOLN
INTRAMUSCULAR | Status: DC | PRN
  Administered 2023-09-01: 4 mg via INTRAVENOUS

## 2023-09-01 MED ORDER — MIDAZOLAM HCL 2 MG/2ML IJ SOLN
INTRAMUSCULAR | Status: AC
  Filled 2023-09-01: qty 2

## 2023-09-01 MED ORDER — HEPARIN SODIUM (PORCINE) 1000 UNIT/ML IJ SOLN
INTRAMUSCULAR | Status: DC | PRN
  Administered 2023-09-01: 4000 [IU] via INTRAVENOUS
  Administered 2023-09-01 (×2): 2000 [IU] via INTRAVENOUS
  Administered 2023-09-01: 4000 [IU] via INTRAVENOUS

## 2023-09-01 MED ORDER — VERAPAMIL HCL 2.5 MG/ML IV SOLN
INTRAVENOUS | Status: DC | PRN
  Administered 2023-09-01: 10 mL via INTRA_ARTERIAL

## 2023-09-01 MED ORDER — SODIUM CHLORIDE 0.9 % IV SOLN
INTRAVENOUS | Status: AC | PRN
  Administered 2023-09-01: 4 ug/kg/min via INTRAVENOUS

## 2023-09-01 MED ORDER — SODIUM CHLORIDE 0.9 % IV SOLN: 250.0000 mL | INTRAVENOUS | Status: DC | PRN

## 2023-09-01 MED ORDER — HEPARIN (PORCINE) IN NACL 1000-0.9 UT/500ML-% IV SOLN
INTRAVENOUS | Status: DC | PRN
  Administered 2023-09-01: 500 mL

## 2023-09-01 MED ORDER — MIDAZOLAM HCL 2 MG/2ML IJ SOLN
INTRAMUSCULAR | Status: DC | PRN
  Administered 2023-09-01 (×2): 1 mg via INTRAVENOUS

## 2023-09-01 MED ORDER — TICAGRELOR 90 MG PO TABS
ORAL_TABLET | ORAL | Status: AC
  Filled 2023-09-01: qty 2

## 2023-09-01 MED ORDER — SODIUM CHLORIDE 0.9 % IV SOLN: INTRAVENOUS | Status: AC

## 2023-09-01 MED ORDER — ASPIRIN 81 MG PO CHEW
81.0000 mg | CHEWABLE_TABLET | ORAL | Status: AC
  Administered 2023-09-01: 81 mg via ORAL
  Filled 2023-09-01: qty 1

## 2023-09-01 MED ORDER — NITROGLYCERIN 1 MG/10 ML FOR IR/CATH LAB
INTRA_ARTERIAL | Status: AC
  Filled 2023-09-01: qty 10

## 2023-09-01 MED ORDER — ONDANSETRON HCL 4 MG/2ML IJ SOLN
INTRAMUSCULAR | Status: AC
  Filled 2023-09-01: qty 2

## 2023-09-01 MED ORDER — CANGRELOR TETRASODIUM 50 MG IV SOLR
INTRAVENOUS | Status: AC
  Filled 2023-09-01: qty 50

## 2023-09-01 MED ORDER — TICAGRELOR 90 MG PO TABS
ORAL_TABLET | ORAL | Status: DC | PRN
  Administered 2023-09-01: 180 mg via ORAL

## 2023-09-01 MED ORDER — LIDOCAINE HCL (PF) 1 % IJ SOLN
INTRAMUSCULAR | Status: DC | PRN
  Administered 2023-09-01: 2 mL

## 2023-09-01 MED ORDER — FENTANYL CITRATE (PF) 100 MCG/2ML IJ SOLN
INTRAMUSCULAR | Status: DC | PRN
  Administered 2023-09-01: 25 ug via INTRAVENOUS

## 2023-09-01 MED ORDER — SODIUM CHLORIDE 0.9 % IV SOLN
4.0000 ug/kg/min | INTRAVENOUS | Status: AC
  Filled 2023-09-01: qty 50

## 2023-09-01 MED ORDER — HYDRALAZINE HCL 20 MG/ML IJ SOLN
10.0000 mg | INTRAMUSCULAR | Status: AC | PRN
Start: 2023-09-01 — End: 2023-09-01

## 2023-09-01 SURGICAL SUPPLY — 22 items
BALLN EMERGE MR 2.0X12 (BALLOONS) ×1
BALLN ~~LOC~~ EMERGE MR 2.25X12 (BALLOONS) ×1
BALLN ~~LOC~~ EMERGE MR 2.75X12 (BALLOONS) ×1
BALLOON EMERGE MR 2.0X12 (BALLOONS) IMPLANT
BALLOON ~~LOC~~ EMERGE MR 2.25X12 (BALLOONS) IMPLANT
BALLOON ~~LOC~~ EMERGE MR 2.75X12 (BALLOONS) IMPLANT
CATH 5FR JL3.5 JR4 ANG PIG MP (CATHETERS) IMPLANT
CATH VISTA GUIDE 6FR JR4 (CATHETERS) IMPLANT
CATH VISTA GUIDE 6FR XBLAD3.5 (CATHETERS) IMPLANT
DEVICE RAD COMP TR BAND LRG (VASCULAR PRODUCTS) IMPLANT
ELECT DEFIB PAD ADLT CADENCE (PAD) IMPLANT
GLIDESHEATH SLEND SS 6F .021 (SHEATH) IMPLANT
GUIDEWIRE INQWIRE 1.5J.035X260 (WIRE) IMPLANT
INQWIRE 1.5J .035X260CM (WIRE) ×1
KIT ENCORE 26 ADVANTAGE (KITS) IMPLANT
KIT ESSENTIALS PG (KITS) IMPLANT
PACK CARDIAC CATHETERIZATION (CUSTOM PROCEDURE TRAY) ×2 IMPLANT
SET ATX-X65L (MISCELLANEOUS) IMPLANT
STENT ONYX FRONTIER 2.0X15 (Permanent Stent) IMPLANT
STENT SYNERGY XD 2.50X24 (Permanent Stent) IMPLANT
SYNERGY XD 2.50X24 (Permanent Stent) ×1 IMPLANT
WIRE RUNTHROUGH IZANAI 014 180 (WIRE) IMPLANT

## 2023-09-01 NOTE — Interval H&P Note (Signed)
History and Physical Interval Note:  09/01/2023 11:11 AM  Stephen Garcia  has presented today for surgery, with the diagnosis of NSTEMI.  The various methods of treatment have been discussed with the patient and family. After consideration of risks, benefits and other options for treatment, the patient has consented to  Procedure(s): LEFT HEART CATH AND CORONARY ANGIOGRAPHY (N/A) as a surgical intervention.  The patient's history has been reviewed, patient examined, no change in status, stable for surgery.  I have reviewed the patient's chart and labs.  Questions were answered to the patient's satisfaction.    Cath Lab Visit (complete for each Cath Lab visit)  Clinical Evaluation Leading to the Procedure:   ACS: Yes.    Non-ACS: N/A  [name]

## 2023-09-01 NOTE — Progress Notes (Signed)
Cardiology Progress Note  Patient ID: Stephen Garcia MRN: [id number] DOB: 08/08/1941 Date of Encounter: 09/01/2023  Primary Cardiologist: None  Subjective   Chief Complaint: None.  HPI: Admitted with non-STEMI.  No further chest discomfort.  N.p.o. for left heart catheterization today.  ROS:  All other ROS reviewed and negative. Pertinent positives noted in the HPI.     Inpatient Medications  Scheduled Meds:  aspirin EC  81 mg Oral Daily   atorvastatin  40 mg Oral Daily   brimonidine  1 drop Left Eye TID   dorzolamide-timolol  1 drop Both Eyes BID   levothyroxine  112 mcg Oral Daily   Netarsudil-Latanoprost  1 drop Both Eyes QHS   pilocarpine  1 drop Left Eye QID   sodium chloride flush  10 mL Intravenous Q12H   Continuous Infusions:  heparin 850 Units/hr (08/31/23 1931)   nitroGLYCERIN 10 mcg/min (08/31/23 1712)   PRN Meds: acetaminophen, nitroGLYCERIN, ondansetron (ZOFRAN) IV   Vital Signs   Vitals:   08/31/23 2000 09/01/23 0100 09/01/23 0503 09/01/23 0726  BP: (!) 105/56 (!) 90/45 117/63 (!) 111/58  Pulse: (!) 48 (!) 53 (!) 54 (!) 54  Resp:  20 20 17   Temp: 99 F (37.2 C) (!) 100.7 F (38.2 C) 99.2 F (37.3 C) 98.9 F (37.2 C)  TempSrc: Oral Oral Oral Oral  SpO2: 96% 91% 90% 95%  Weight:   77.4 kg   Height:        Intake/Output Summary (Last 24 hours) at 09/01/2023 0835 Last data filed at 08/31/2023 1712 Gross per 24 hour  Intake 370.02 ml  Output --  Net 370.02 ml      09/01/2023    5:03 AM 08/30/2023    7:00 PM 07/01/2023    8:47 AM  Last 3 Weights  Weight (lbs) 170 lb 9.6 oz 173 lb 15.1 oz 174 lb  Weight (kg) 77.384 kg 78.9 kg 78.926 kg      Telemetry  Overnight telemetry shows sinus bradycardia 40 to 50 bpm, which I personally reviewed.   ECG  The most recent ECG shows sinus bradycardia heart rate 57, no acute ischemic changes, which I personally reviewed.   Physical Exam   Vitals:   08/31/23 2000 09/01/23 0100 09/01/23 0503 09/01/23  0726  BP: (!) 105/56 (!) 90/45 117/63 (!) 111/58  Pulse: (!) 48 (!) 53 (!) 54 (!) 54  Resp:  20 20 17   Temp: 99 F (37.2 C) (!) 100.7 F (38.2 C) 99.2 F (37.3 C) 98.9 F (37.2 C)  TempSrc: Oral Oral Oral Oral  SpO2: 96% 91% 90% 95%  Weight:   77.4 kg   Height:        Intake/Output Summary (Last 24 hours) at 09/01/2023 0835 Last data filed at 08/31/2023 1712 Gross per 24 hour  Intake 370.02 ml  Output --  Net 370.02 ml       09/01/2023    5:03 AM 08/30/2023    7:00 PM 07/01/2023    8:47 AM  Last 3 Weights  Weight (lbs) 170 lb 9.6 oz 173 lb 15.1 oz 174 lb  Weight (kg) 77.384 kg 78.9 kg 78.926 kg    Body mass index is 29.28 kg/m.  General: Well nourished, well developed, in no acute distress Head: Atraumatic, normal size  Eyes: PEERLA, EOMI  Neck: Supple, no JVD Endocrine: No thryomegaly Cardiac: Normal S1, S2; RRR; no murmurs, rubs, or gallops Lungs: Clear to auscultation bilaterally, no wheezing, rhonchi or rales  Abd:  Soft, nontender, no hepatomegaly  Ext: No edema, pulses 2+ Musculoskeletal: No deformities, BUE and BLE strength normal and equal Skin: Warm and dry, no rashes   Neuro: Alert and oriented to person, place, time, and situation, CNII-XII grossly intact, no focal deficits  Psych: Normal mood and affect   Labs  High Sensitivity Troponin:   Recent Labs  Lab 08/30/23 1742 08/30/23 1928  TROPONINIHS 340* 908*     Cardiac EnzymesNo results for input(s): "TROPONINI" in the last 168 hours. No results for input(s): "TROPIPOC" in the last 168 hours.  Chemistry Recent Labs  Lab 08/30/23 1742 08/31/23 0314  NA 135 137  K 3.9 3.8  CL 106 106  CO2 21* 22  GLUCOSE 213* 117*  BUN 14 12  CREATININE 0.92 0.88  CALCIUM 9.1 9.3  PROT 6.7  --   ALBUMIN 3.6  --   AST 22  --   ALT 16  --   ALKPHOS 38  --   BILITOT 0.7  --   GFRNONAA >60 >60  ANIONGAP 8 9    Hematology Recent Labs  Lab 08/30/23 1742 08/31/23 0314 09/01/23 0304  WBC 9.2 10.3 6.7   RBC 4.25 4.10* 3.90*  HGB 12.6* 12.4* 11.8*  HCT 38.2* 36.4* 35.0*  MCV 89.9 88.8 89.7  MCH 29.6 30.2 30.3  MCHC 33.0 34.1 33.7  RDW 12.8 13.0 13.2  PLT 174 159 140*   BNPNo results for input(s): "BNP", "PROBNP" in the last 168 hours.  DDimer  Recent Labs  Lab 08/30/23 1934  DDIMER 0.94*     Radiology  ECHOCARDIOGRAM COMPLETE  Result Date: 08/31/2023    ECHOCARDIOGRAM REPORT   Patient Name:   Stephen Garcia Date of Exam: 08/31/2023 Medical Rec #:  [id number]   Height:       64.0 in Accession #:    [id number]  Weight:       173.9 lb Date of Birth:  04/02/1941    BSA:          1.844 m Patient Age:    82 years    BP:           108/53 mmHg Patient Gender: M           HR:           50 bpm. Exam Location:  Inpatient Procedure: 2D Echo, Cardiac Doppler and Color Doppler Indications:    Chest Pain  History:        Patient has no prior history of Echocardiogram examinations.  Sonographer:    Darlys Gales Referring Phys: [id number] KAMAL H HENDERSON IMPRESSIONS  1. Left ventricular ejection fraction, by estimation, is 70 to 75%. The left ventricle has hyperdynamic function. The left ventricle has no regional wall motion abnormalities. Left ventricular diastolic parameters were normal.  2. Right ventricular systolic function is normal. The right ventricular size is normal. There is normal pulmonary artery systolic pressure.  3. The mitral valve is normal in structure. Trivial mitral valve regurgitation. No evidence of mitral stenosis.  4. The aortic valve is tricuspid. There is mild calcification of the aortic valve. Aortic valve regurgitation is mild to moderate. No aortic stenosis is present.  5. Aortic dilatation noted. There is borderline dilatation of the ascending aorta, measuring 37 mm.  6. The inferior vena cava is normal in size with greater than 50% respiratory variability, suggesting right atrial pressure of 3 mmHg. Comparison(s): No prior Echocardiogram. FINDINGS  Left Ventricle: Left ventricular  ejection fraction, by estimation,  is 70 to 75%. The left ventricle has hyperdynamic function. The left ventricle has no regional wall motion abnormalities. The left ventricular internal cavity size was normal in size. There is no left ventricular hypertrophy. Left ventricular diastolic parameters were normal. Right Ventricle: The right ventricular size is normal. No increase in right ventricular wall thickness. Right ventricular systolic function is normal. There is normal pulmonary artery systolic pressure. The tricuspid regurgitant velocity is 2.29 m/s, and  with an assumed right atrial pressure of 3 mmHg, the estimated right ventricular systolic pressure is 24.0 mmHg. Left [facility]: Left atrial size was normal in size. Right [facility]: Right atrial size was normal in size. Pericardium: There is no evidence of pericardial effusion. Mitral Valve: The mitral valve is normal in structure. Trivial mitral valve regurgitation. No evidence of mitral valve stenosis. Tricuspid Valve: The tricuspid valve is normal in structure. Tricuspid valve regurgitation is mild . No evidence of tricuspid stenosis. Aortic Valve: The aortic valve is tricuspid. There is mild calcification of the aortic valve. Aortic valve regurgitation is mild to moderate. Aortic regurgitation PHT measures 877 msec. No aortic stenosis is present. Aortic valve mean gradient measures 5.0 mmHg. Aortic valve peak gradient measures 8.8 mmHg. Aortic valve area, by VTI measures 2.72 cm. Pulmonic Valve: The pulmonic valve was normal in structure. Pulmonic valve regurgitation is trivial. No evidence of pulmonic stenosis. Aorta: The aortic root is normal in size and structure and aortic dilatation noted. There is borderline dilatation of the ascending aorta, measuring 37 mm. Venous: The inferior vena cava is normal in size with greater than 50% respiratory variability, suggesting right atrial pressure of 3 mmHg. IAS/Shunts: No atrial level shunt detected by color flow  Doppler.  LEFT VENTRICLE PLAX 2D LVIDd:         4.60 cm   Diastology LVIDs:         3.00 cm   LV e' medial:    6.64 cm/s LV PW:         1.10 cm   LV E/e' medial:  12.5 LV IVS:        1.00 cm   LV e' lateral:   8.49 cm/s LVOT diam:     1.90 cm   LV E/e' lateral: 9.8 LV SV:         86 LV SV Index:   47 LVOT Area:     2.84 cm  RIGHT VENTRICLE RV S prime:     12.30 cm/s TAPSE (M-mode): 2.9 cm LEFT ATRIUM             Index        RIGHT ATRIUM           Index LA Vol (A2C):   39.9 ml 21.64 ml/m  RA Area:     13.30 cm LA Vol (A4C):   37.9 ml 20.56 ml/m  RA Volume:   32.20 ml  17.47 ml/m LA Biplane Vol: 39.6 ml 21.48 ml/m  AORTIC VALVE AV Area (Vmax):    2.70 cm AV Area (Vmean):   2.38 cm AV Area (VTI):     2.72 cm AV Vmax:           148.00 cm/s AV Vmean:          97.700 cm/s AV VTI:            0.316 m AV Peak Grad:      8.8 mmHg AV Mean Grad:      5.0 mmHg LVOT Vmax:  141.00 cm/s LVOT Vmean:        82.000 cm/s LVOT VTI:          0.303 m LVOT/AV VTI ratio: 0.96 AI PHT:            877 msec  AORTA Ao Root diam: 3.30 cm Ao Asc diam:  3.70 cm MITRAL VALVE               TRICUSPID VALVE MV Area (PHT): 4.60 cm    TR Peak grad:   21.0 mmHg MV Decel Time: 165 msec    TR Vmax:        229.00 cm/s MV E velocity: 83.00 cm/s MV A velocity: 76.70 cm/s  SHUNTS MV E/A ratio:  1.08        Systemic VTI:  0.30 m                            Systemic Diam: 1.90 cm Vishnu Priya Mallipeddi Electronically signed by Winfield Rast Mallipeddi Signature Date/Time: 08/31/2023/12:17:52 PM    Final    CT Angio Chest PE W and/or Wo Contrast  Result Date: 08/30/2023 CLINICAL DATA:  Chest pain and shortness of breath EXAM: CT ANGIOGRAPHY CHEST WITH CONTRAST TECHNIQUE: Multidetector CT imaging of the chest was performed using the standard protocol during bolus administration of intravenous contrast. Multiplanar CT image reconstructions and MIPs were obtained to evaluate the vascular anatomy. RADIATION DOSE REDUCTION: This exam was performed  according to the departmental dose-optimization program which includes automated exposure control, adjustment of the mA and/or kV according to patient size and/or use of iterative reconstruction technique. CONTRAST:  75mL OMNIPAQUE IOHEXOL 350 MG/ML SOLN COMPARISON:  Chest x-ray from earlier in the same day, 02/25/2023, 12/17/2022. FINDINGS: Cardiovascular: Atherosclerotic calcifications of the thoracic aorta are noted. No aneurysmal dilatation is seen. No cardiac enlargement is noted. Pulmonary artery is well visualized within normal branching pattern. No filling defect to suggest pulmonary embolism is noted. Coronary calcifications are seen. Mediastinum/Nodes: Thoracic inlet is within normal limits. The esophagus as visualized is within normal limits. No hilar or mediastinal adenopathy is seen. Lungs/Pleura: Lungs are well aerated bilaterally. Mild emphysematous changes are noted. Nodularity is noted in the left lung base similar to that seen on the prior exam. There is slight increase in size up to 1.6 cm in the transverse imaging. This may simply represent persistent scarring. No other focal nodularity is noted. Upper Abdomen: No acute abnormality. Musculoskeletal: No acute bony abnormality is noted. Review of the MIP images confirms the above findings. IMPRESSION: No evidence of pulmonary emboli. Slight increase in the size of the nodular area in the left lateral lower lobe when compared with the prior exam. Follow-up in 3-6 months is recommended to assess for stability. Aortic Atherosclerosis (ICD10-I70.0) and Emphysema (ICD10-J43.9). Electronically Signed   By: Alcide Clever M.D.   On: 08/30/2023 23:09   DG Chest 2 View  Result Date: 08/30/2023 CLINICAL DATA:  Substernal chest pain, nausea, diaphoresis. EXAM: CHEST - 2 VIEW COMPARISON:  Chest CT without contrast 02/25/2023 FINDINGS: There is mild cardiomegaly. Mild aortic ectasia and atherosclerosis with stable mediastinum. The central vessels are mildly  prominent, without overt interstitial edema. There are mild chronic interstitial changes in both bases without focal pneumonia or pleural effusion. Thoracic cage is intact with osteopenia. IMPRESSION: 1. Mild central vascular prominence without overt edema. No other evidence of acute chest process. 2. Mild cardiomegaly and aortic atherosclerosis. Electronically Signed  By: Almira Bar M.D.   On: 08/30/2023 20:44    Cardiac Studies  TTE 08/31/2023  1. Left ventricular ejection fraction, by estimation, is 70 to 75%. The  left ventricle has hyperdynamic function. The left ventricle has no  regional wall motion abnormalities. Left ventricular diastolic parameters  were normal.   2. Right ventricular systolic function is normal. The right ventricular  size is normal. There is normal pulmonary artery systolic pressure.   3. The mitral valve is normal in structure. Trivial mitral valve  regurgitation. No evidence of mitral stenosis.   4. The aortic valve is tricuspid. There is mild calcification of the  aortic valve. Aortic valve regurgitation is mild to moderate. No aortic  stenosis is present.   5. Aortic dilatation noted. There is borderline dilatation of the  ascending aorta, measuring 37 mm.   6. The inferior vena cava is normal in size with greater than 50%  respiratory variability, suggesting right atrial pressure of 3 mmHg.   Patient Profile  Stephen Garcia is a 82 y.o. male with hypothyroidism, hyperlipidemia admitted on 08/31/2023 for non-STEMI.  Assessment & Plan   # Non-STEMI -Continue aspirin and heparin drip for now.  No beta-blocker due to bradycardia. -Continue high intensity statin. -Stop nitroglycerin drip. -N.p.o. for left heart catheterization today.  Risk and benefits explained.  He is willing to proceed. -Echo shows normal LV function.  # Chronic angle-closure glaucoma -He reports recent ophthalmologic intervention.  He had a tube placed in the right eye in September.   Reviewed records from [facility].  Do not believe this precludes him from catheterization today.  Continue with anticoagulation as above.  # Hypothyroidism -Home Synthroid  # Hyperlipidemia -Lipitor 80  FEN -Precath fluids -DVT PPx: Heparin drip -Diet: N.p.o. -Disposition: Pending left heart catheterization today.  For questions or updates, please contact [facility] Please consult [email or web address] for contact info under        Signed, Gerri Spore T. Flora Lipps, MD, [facility]  [facility]  09/01/2023 8:35 AM

## 2023-09-01 NOTE — Progress Notes (Signed)
PHARMACY - ANTICOAGULATION CONSULT NOTE  Pharmacy Consult for heparin  Indication: chest pain/ACS  Allergies  Allergen Reactions   Fluticasone-Salmeterol Itching    Patient Measurements: Height: 5\' 4"  (162.6 cm) Weight: 77.4 kg (170 lb 9.6 oz) IBW/kg (Calculated) : 59.2 Heparin Dosing Weight: 75.5kg   Vital Signs: Temp: 98.9 F (37.2 C) (10/21 0726) Temp Source: Oral (10/21 0726) BP: 111/58 (10/21 0726) Pulse Rate: 54 (10/21 0726)  Labs: Recent Labs    08/30/23 1742 08/30/23 1928 08/31/23 0314 08/31/23 0538 08/31/23 1330 09/01/23 0304  HGB 12.6*  --  12.4*  --   --  11.8*  HCT 38.2*  --  36.4*  --   --  35.0*  PLT 174  --  159  --   --  140*  HEPARINUNFRC  --   --   --  0.65 0.73* 0.51  CREATININE 0.92  --  0.88  --   --   --   TROPONINIHS 340* 908*  --   --   --   --     Estimated Creatinine Clearance: 59.8 mL/min (by C-G formula based on SCr of 0.88 mg/dL).   Medical History: Past Medical History:  Diagnosis Date   Arthritis    Glaucoma    Hyperlipidemia    Hypothyroidism    Pre-diabetes    Assessment: Patient admitted with CC of chest pain, trop 340. HgB 12.6 and PLTs 174. Not on anticoagulation PTA. Pharmacy consulted to dose heparin.    Heparin therapeutic this AM. Plan for cath today.   Goal of Therapy:  Heparin level 0.3-0.7 units/ml Monitor platelets by anticoagulation protocol: Yes   Plan:  Cont heprain 850u/hr.  Daily heparin level F/u post cath Monitor for s/sx of bleeding.   Ulyses Southward, PharmD, BCIDP, AAHIVP, CPP Infectious Disease Pharmacist 09/01/2023 8:12 AM

## 2023-09-01 NOTE — H&P (View-Only) (Signed)
Cardiology Progress Note  Patient ID: Stephen Garcia MRN: [id number] DOB: 08/08/1941 Date of Encounter: 09/01/2023  Primary Cardiologist: None  Subjective   Chief Complaint: None.  HPI: Admitted with non-STEMI.  No further chest discomfort.  N.p.o. for left heart catheterization today.  ROS:  All other ROS reviewed and negative. Pertinent positives noted in the HPI.     Inpatient Medications  Scheduled Meds:  aspirin EC  81 mg Oral Daily   atorvastatin  40 mg Oral Daily   brimonidine  1 drop Left Eye TID   dorzolamide-timolol  1 drop Both Eyes BID   levothyroxine  112 mcg Oral Daily   Netarsudil-Latanoprost  1 drop Both Eyes QHS   pilocarpine  1 drop Left Eye QID   sodium chloride flush  10 mL Intravenous Q12H   Continuous Infusions:  heparin 850 Units/hr (08/31/23 1931)   nitroGLYCERIN 10 mcg/min (08/31/23 1712)   PRN Meds: acetaminophen, nitroGLYCERIN, ondansetron (ZOFRAN) IV   Vital Signs   Vitals:   08/31/23 2000 09/01/23 0100 09/01/23 0503 09/01/23 0726  BP: (!) 105/56 (!) 90/45 117/63 (!) 111/58  Pulse: (!) 48 (!) 53 (!) 54 (!) 54  Resp:  20 20 17   Temp: 99 F (37.2 C) (!) 100.7 F (38.2 C) 99.2 F (37.3 C) 98.9 F (37.2 C)  TempSrc: Oral Oral Oral Oral  SpO2: 96% 91% 90% 95%  Weight:   77.4 kg   Height:        Intake/Output Summary (Last 24 hours) at 09/01/2023 0835 Last data filed at 08/31/2023 1712 Gross per 24 hour  Intake 370.02 ml  Output --  Net 370.02 ml      09/01/2023    5:03 AM 08/30/2023    7:00 PM 07/01/2023    8:47 AM  Last 3 Weights  Weight (lbs) 170 lb 9.6 oz 173 lb 15.1 oz 174 lb  Weight (kg) 77.384 kg 78.9 kg 78.926 kg      Telemetry  Overnight telemetry shows sinus bradycardia 40 to 50 bpm, which I personally reviewed.   ECG  The most recent ECG shows sinus bradycardia heart rate 57, no acute ischemic changes, which I personally reviewed.   Physical Exam   Vitals:   08/31/23 2000 09/01/23 0100 09/01/23 0503 09/01/23  0726  BP: (!) 105/56 (!) 90/45 117/63 (!) 111/58  Pulse: (!) 48 (!) 53 (!) 54 (!) 54  Resp:  20 20 17   Temp: 99 F (37.2 C) (!) 100.7 F (38.2 C) 99.2 F (37.3 C) 98.9 F (37.2 C)  TempSrc: Oral Oral Oral Oral  SpO2: 96% 91% 90% 95%  Weight:   77.4 kg   Height:        Intake/Output Summary (Last 24 hours) at 09/01/2023 0835 Last data filed at 08/31/2023 1712 Gross per 24 hour  Intake 370.02 ml  Output --  Net 370.02 ml       09/01/2023    5:03 AM 08/30/2023    7:00 PM 07/01/2023    8:47 AM  Last 3 Weights  Weight (lbs) 170 lb 9.6 oz 173 lb 15.1 oz 174 lb  Weight (kg) 77.384 kg 78.9 kg 78.926 kg    Body mass index is 29.28 kg/m.  General: Well nourished, well developed, in no acute distress Head: Atraumatic, normal size  Eyes: PEERLA, EOMI  Neck: Supple, no JVD Endocrine: No thryomegaly Cardiac: Normal S1, S2; RRR; no murmurs, rubs, or gallops Lungs: Clear to auscultation bilaterally, no wheezing, rhonchi or rales  Abd:  Soft, nontender, no hepatomegaly  Ext: No edema, pulses 2+ Musculoskeletal: No deformities, BUE and BLE strength normal and equal Skin: Warm and dry, no rashes   Neuro: Alert and oriented to person, place, time, and situation, CNII-XII grossly intact, no focal deficits  Psych: Normal mood and affect   Labs  High Sensitivity Troponin:   Recent Labs  Lab 08/30/23 1742 08/30/23 1928  TROPONINIHS 340* 908*     Cardiac EnzymesNo results for input(s): "TROPONINI" in the last 168 hours. No results for input(s): "TROPIPOC" in the last 168 hours.  Chemistry Recent Labs  Lab 08/30/23 1742 08/31/23 0314  NA 135 137  K 3.9 3.8  CL 106 106  CO2 21* 22  GLUCOSE 213* 117*  BUN 14 12  CREATININE 0.92 0.88  CALCIUM 9.1 9.3  PROT 6.7  --   ALBUMIN 3.6  --   AST 22  --   ALT 16  --   ALKPHOS 38  --   BILITOT 0.7  --   GFRNONAA >60 >60  ANIONGAP 8 9    Hematology Recent Labs  Lab 08/30/23 1742 08/31/23 0314 09/01/23 0304  WBC 9.2 10.3 6.7   RBC 4.25 4.10* 3.90*  HGB 12.6* 12.4* 11.8*  HCT 38.2* 36.4* 35.0*  MCV 89.9 88.8 89.7  MCH 29.6 30.2 30.3  MCHC 33.0 34.1 33.7  RDW 12.8 13.0 13.2  PLT 174 159 140*   BNPNo results for input(s): "BNP", "PROBNP" in the last 168 hours.  DDimer  Recent Labs  Lab 08/30/23 1934  DDIMER 0.94*     Radiology  ECHOCARDIOGRAM COMPLETE  Result Date: 08/31/2023    ECHOCARDIOGRAM REPORT   Patient Name:   Stephen Garcia Date of Exam: 08/31/2023 Medical Rec #:  [id number]   Height:       64.0 in Accession #:    [id number]  Weight:       173.9 lb Date of Birth:  04/02/1941    BSA:          1.844 m Patient Age:    82 years    BP:           108/53 mmHg Patient Gender: M           HR:           50 bpm. Exam Location:  Inpatient Procedure: 2D Echo, Cardiac Doppler and Color Doppler Indications:    Chest Pain  History:        Patient has no prior history of Echocardiogram examinations.  Sonographer:    Darlys Gales Referring Phys: [id number] KAMAL H HENDERSON IMPRESSIONS  1. Left ventricular ejection fraction, by estimation, is 70 to 75%. The left ventricle has hyperdynamic function. The left ventricle has no regional wall motion abnormalities. Left ventricular diastolic parameters were normal.  2. Right ventricular systolic function is normal. The right ventricular size is normal. There is normal pulmonary artery systolic pressure.  3. The mitral valve is normal in structure. Trivial mitral valve regurgitation. No evidence of mitral stenosis.  4. The aortic valve is tricuspid. There is mild calcification of the aortic valve. Aortic valve regurgitation is mild to moderate. No aortic stenosis is present.  5. Aortic dilatation noted. There is borderline dilatation of the ascending aorta, measuring 37 mm.  6. The inferior vena cava is normal in size with greater than 50% respiratory variability, suggesting right atrial pressure of 3 mmHg. Comparison(s): No prior Echocardiogram. FINDINGS  Left Ventricle: Left ventricular  ejection fraction, by estimation,  is 70 to 75%. The left ventricle has hyperdynamic function. The left ventricle has no regional wall motion abnormalities. The left ventricular internal cavity size was normal in size. There is no left ventricular hypertrophy. Left ventricular diastolic parameters were normal. Right Ventricle: The right ventricular size is normal. No increase in right ventricular wall thickness. Right ventricular systolic function is normal. There is normal pulmonary artery systolic pressure. The tricuspid regurgitant velocity is 2.29 m/s, and  with an assumed right atrial pressure of 3 mmHg, the estimated right ventricular systolic pressure is 24.0 mmHg. Left [facility]: Left atrial size was normal in size. Right [facility]: Right atrial size was normal in size. Pericardium: There is no evidence of pericardial effusion. Mitral Valve: The mitral valve is normal in structure. Trivial mitral valve regurgitation. No evidence of mitral valve stenosis. Tricuspid Valve: The tricuspid valve is normal in structure. Tricuspid valve regurgitation is mild . No evidence of tricuspid stenosis. Aortic Valve: The aortic valve is tricuspid. There is mild calcification of the aortic valve. Aortic valve regurgitation is mild to moderate. Aortic regurgitation PHT measures 877 msec. No aortic stenosis is present. Aortic valve mean gradient measures 5.0 mmHg. Aortic valve peak gradient measures 8.8 mmHg. Aortic valve area, by VTI measures 2.72 cm. Pulmonic Valve: The pulmonic valve was normal in structure. Pulmonic valve regurgitation is trivial. No evidence of pulmonic stenosis. Aorta: The aortic root is normal in size and structure and aortic dilatation noted. There is borderline dilatation of the ascending aorta, measuring 37 mm. Venous: The inferior vena cava is normal in size with greater than 50% respiratory variability, suggesting right atrial pressure of 3 mmHg. IAS/Shunts: No atrial level shunt detected by color flow  Doppler.  LEFT VENTRICLE PLAX 2D LVIDd:         4.60 cm   Diastology LVIDs:         3.00 cm   LV e' medial:    6.64 cm/s LV PW:         1.10 cm   LV E/e' medial:  12.5 LV IVS:        1.00 cm   LV e' lateral:   8.49 cm/s LVOT diam:     1.90 cm   LV E/e' lateral: 9.8 LV SV:         86 LV SV Index:   47 LVOT Area:     2.84 cm  RIGHT VENTRICLE RV S prime:     12.30 cm/s TAPSE (M-mode): 2.9 cm LEFT ATRIUM             Index        RIGHT ATRIUM           Index LA Vol (A2C):   39.9 ml 21.64 ml/m  RA Area:     13.30 cm LA Vol (A4C):   37.9 ml 20.56 ml/m  RA Volume:   32.20 ml  17.47 ml/m LA Biplane Vol: 39.6 ml 21.48 ml/m  AORTIC VALVE AV Area (Vmax):    2.70 cm AV Area (Vmean):   2.38 cm AV Area (VTI):     2.72 cm AV Vmax:           148.00 cm/s AV Vmean:          97.700 cm/s AV VTI:            0.316 m AV Peak Grad:      8.8 mmHg AV Mean Grad:      5.0 mmHg LVOT Vmax:  141.00 cm/s LVOT Vmean:        82.000 cm/s LVOT VTI:          0.303 m LVOT/AV VTI ratio: 0.96 AI PHT:            877 msec  AORTA Ao Root diam: 3.30 cm Ao Asc diam:  3.70 cm MITRAL VALVE               TRICUSPID VALVE MV Area (PHT): 4.60 cm    TR Peak grad:   21.0 mmHg MV Decel Time: 165 msec    TR Vmax:        229.00 cm/s MV E velocity: 83.00 cm/s MV A velocity: 76.70 cm/s  SHUNTS MV E/A ratio:  1.08        Systemic VTI:  0.30 m                            Systemic Diam: 1.90 cm Vishnu Priya Mallipeddi Electronically signed by Winfield Rast Mallipeddi Signature Date/Time: 08/31/2023/12:17:52 PM    Final    CT Angio Chest PE W and/or Wo Contrast  Result Date: 08/30/2023 CLINICAL DATA:  Chest pain and shortness of breath EXAM: CT ANGIOGRAPHY CHEST WITH CONTRAST TECHNIQUE: Multidetector CT imaging of the chest was performed using the standard protocol during bolus administration of intravenous contrast. Multiplanar CT image reconstructions and MIPs were obtained to evaluate the vascular anatomy. RADIATION DOSE REDUCTION: This exam was performed  according to the departmental dose-optimization program which includes automated exposure control, adjustment of the mA and/or kV according to patient size and/or use of iterative reconstruction technique. CONTRAST:  75mL OMNIPAQUE IOHEXOL 350 MG/ML SOLN COMPARISON:  Chest x-ray from earlier in the same day, 02/25/2023, 12/17/2022. FINDINGS: Cardiovascular: Atherosclerotic calcifications of the thoracic aorta are noted. No aneurysmal dilatation is seen. No cardiac enlargement is noted. Pulmonary artery is well visualized within normal branching pattern. No filling defect to suggest pulmonary embolism is noted. Coronary calcifications are seen. Mediastinum/Nodes: Thoracic inlet is within normal limits. The esophagus as visualized is within normal limits. No hilar or mediastinal adenopathy is seen. Lungs/Pleura: Lungs are well aerated bilaterally. Mild emphysematous changes are noted. Nodularity is noted in the left lung base similar to that seen on the prior exam. There is slight increase in size up to 1.6 cm in the transverse imaging. This may simply represent persistent scarring. No other focal nodularity is noted. Upper Abdomen: No acute abnormality. Musculoskeletal: No acute bony abnormality is noted. Review of the MIP images confirms the above findings. IMPRESSION: No evidence of pulmonary emboli. Slight increase in the size of the nodular area in the left lateral lower lobe when compared with the prior exam. Follow-up in 3-6 months is recommended to assess for stability. Aortic Atherosclerosis (ICD10-I70.0) and Emphysema (ICD10-J43.9). Electronically Signed   By: Alcide Clever M.D.   On: 08/30/2023 23:09   DG Chest 2 View  Result Date: 08/30/2023 CLINICAL DATA:  Substernal chest pain, nausea, diaphoresis. EXAM: CHEST - 2 VIEW COMPARISON:  Chest CT without contrast 02/25/2023 FINDINGS: There is mild cardiomegaly. Mild aortic ectasia and atherosclerosis with stable mediastinum. The central vessels are mildly  prominent, without overt interstitial edema. There are mild chronic interstitial changes in both bases without focal pneumonia or pleural effusion. Thoracic cage is intact with osteopenia. IMPRESSION: 1. Mild central vascular prominence without overt edema. No other evidence of acute chest process. 2. Mild cardiomegaly and aortic atherosclerosis. Electronically Signed  By: Almira Bar M.D.   On: 08/30/2023 20:44    Cardiac Studies  TTE 08/31/2023  1. Left ventricular ejection fraction, by estimation, is 70 to 75%. The  left ventricle has hyperdynamic function. The left ventricle has no  regional wall motion abnormalities. Left ventricular diastolic parameters  were normal.   2. Right ventricular systolic function is normal. The right ventricular  size is normal. There is normal pulmonary artery systolic pressure.   3. The mitral valve is normal in structure. Trivial mitral valve  regurgitation. No evidence of mitral stenosis.   4. The aortic valve is tricuspid. There is mild calcification of the  aortic valve. Aortic valve regurgitation is mild to moderate. No aortic  stenosis is present.   5. Aortic dilatation noted. There is borderline dilatation of the  ascending aorta, measuring 37 mm.   6. The inferior vena cava is normal in size with greater than 50%  respiratory variability, suggesting right atrial pressure of 3 mmHg.   Patient Profile  Stephen Garcia is a 82 y.o. male with hypothyroidism, hyperlipidemia admitted on 08/31/2023 for non-STEMI.  Assessment & Plan   # Non-STEMI -Continue aspirin and heparin drip for now.  No beta-blocker due to bradycardia. -Continue high intensity statin. -Stop nitroglycerin drip. -N.p.o. for left heart catheterization today.  Risk and benefits explained.  He is willing to proceed. -Echo shows normal LV function.  # Chronic angle-closure glaucoma -He reports recent ophthalmologic intervention.  He had a tube placed in the right eye in September.   Reviewed records from [facility].  Do not believe this precludes him from catheterization today.  Continue with anticoagulation as above.  # Hypothyroidism -Home Synthroid  # Hyperlipidemia -Lipitor 80  FEN -Precath fluids -DVT PPx: Heparin drip -Diet: N.p.o. -Disposition: Pending left heart catheterization today.  For questions or updates, please contact [facility] Please consult [email or web address] for contact info under        Signed, Gerri Spore T. Flora Lipps, MD, [facility]  [facility]  09/01/2023 8:35 AM

## 2023-09-01 NOTE — TOC Benefit Eligibility Note (Signed)
Patient Product/process development scientist completed.    The patient is insured through [facility]. Patient has Medicare and is not eligible for a copay card, but may be able to apply for patient assistance, if available.    Ran test claim for Brilinta 90 mg and the current 30 day co-pay is $113.56 due to being in Coverage Gap (donut hole).   This test claim was processed through [facility]- copay amounts may vary at other pharmacies due to pharmacy/plan contracts, or as the patient moves through the different stages of their insurance plan.     Roland Earl, CPHT Pharmacy Technician III Certified Patient Advocate [facility] Patient Advocate Team Direct Number: [phone]  Fax: [phone]

## 2023-09-02 ENCOUNTER — Other Ambulatory Visit (HOSPITAL_COMMUNITY): Payer: Self-pay

## 2023-09-02 ENCOUNTER — Telehealth: Payer: Self-pay | Admitting: Cardiology

## 2023-09-02 ENCOUNTER — Encounter (HOSPITAL_COMMUNITY): Payer: Self-pay | Admitting: Internal Medicine

## 2023-09-02 DIAGNOSIS — E039 Hypothyroidism, unspecified: Secondary | ICD-10-CM | POA: Diagnosis not present

## 2023-09-02 DIAGNOSIS — I214 Non-ST elevation (NSTEMI) myocardial infarction: Secondary | ICD-10-CM | POA: Diagnosis not present

## 2023-09-02 LAB — BASIC METABOLIC PANEL
Anion gap: 11 (ref 5–15)
BUN: 8 mg/dL (ref 8–23)
CO2: 20 mmol/L — ABNORMAL LOW (ref 22–32)
Calcium: 8.7 mg/dL — ABNORMAL LOW (ref 8.9–10.3)
Chloride: 106 mmol/L (ref 98–111)
Creatinine, Ser: 0.9 mg/dL (ref 0.61–1.24)
GFR, Estimated: >60 mL/min (ref >60)
Glucose, Bld: 83 mg/dL (ref 70–99)
Potassium: 3.3 mmol/L — ABNORMAL LOW (ref 3.5–5.1)
Sodium: 137 mmol/L (ref 135–145)

## 2023-09-02 LAB — CBC
HCT: 36.5 % — ABNORMAL LOW (ref 39.0–52.0)
Hemoglobin: 12.3 g/dL — ABNORMAL LOW (ref 13.0–17.0)
MCH: 30.8 pg (ref 26.0–34.0)
MCHC: 33.7 g/dL (ref 30.0–36.0)
MCV: 91.3 fL (ref 80.0–100.0)
Platelets: 148 10*3/uL — ABNORMAL LOW (ref 150–400)
RBC: 4 MIL/uL — ABNORMAL LOW (ref 4.22–5.81)
RDW: 12.9 % (ref 11.5–15.5)
WBC: 6 10*3/uL (ref 4.0–10.5)
nRBC: 0 % (ref 0.0–0.2)

## 2023-09-02 LAB — LIPOPROTEIN A (LPA): Lipoprotein (a): 92.2 nmol/L — ABNORMAL HIGH (ref <75.0)

## 2023-09-02 MED ORDER — POTASSIUM CHLORIDE CRYS ER 20 MEQ PO TBCR
40.0000 meq | EXTENDED_RELEASE_TABLET | Freq: Once | ORAL | Status: AC
  Administered 2023-09-02: 40 meq via ORAL
  Filled 2023-09-02: qty 2

## 2023-09-02 MED ORDER — NITROGLYCERIN 0.4 MG SL SUBL
0.4000 mg | SUBLINGUAL_TABLET | SUBLINGUAL | 2 refills | Status: AC | PRN
  Filled 2023-09-02: qty 25, 5d supply, fill #0

## 2023-09-02 MED ORDER — ATORVASTATIN CALCIUM 40 MG PO TABS
40.0000 mg | ORAL_TABLET | Freq: Every day | ORAL | 3 refills | Status: DC
  Filled 2023-09-02: qty 30, 30d supply, fill #0

## 2023-09-02 MED ORDER — ASPIRIN 81 MG PO TBEC
81.0000 mg | DELAYED_RELEASE_TABLET | Freq: Every day | ORAL | 12 refills | Status: DC
  Filled 2023-09-02: qty 30, 30d supply, fill #0

## 2023-09-02 MED ORDER — TICAGRELOR 90 MG PO TABS
90.0000 mg | ORAL_TABLET | Freq: Two times a day (BID) | ORAL | 3 refills | Status: DC
  Filled 2023-09-02: qty 60, 30d supply, fill #0

## 2023-09-02 NOTE — Telephone Encounter (Signed)
   Transition of Care Follow-up Phone Call Request    Patient Name: Stephen Garcia Date of Birth: 06-06-1941 Date of Encounter: 09/02/2023  Primary Care Provider:  Tally Joe, MD Primary Cardiologist:  None  [name] has been scheduled for a transition of care follow up appointment with a [facility] provider:  09/09/23 @1 :55pm with Robet Leu PA-C  Please reach out to [name] within 48 hours of discharge to confirm appointment and review transition of care protocol questionnaire. Anticipated discharge date: 09/02/23  Perlie Gold, PA-C  09/02/2023, 8:42 AM

## 2023-09-02 NOTE — Plan of Care (Signed)

## 2023-09-02 NOTE — Plan of Care (Signed)

## 2023-09-02 NOTE — Care Management (Signed)
Met with patient and wife at bedside, provided with Brilinta card with instructions for use. They are aware that if meds are prepared by [facility] pharmacy for DC, TOC will use card for first free month.

## 2023-09-02 NOTE — Discharge Summary (Signed)
Discharge Summary    Patient ID: Stephen Garcia MRN: [id number]; DOB: 10-16-1941  Admit date: 08/30/2023 Discharge date: 09/02/2023  PCP:  Tally Joe, MD   [facility] Providers Cardiologist:  None        Discharge Diagnoses    Principal Problem:   NSTEMI (non-ST elevated myocardial infarction) [facility]) Active Problems:   Hypothyroidism   Non-ST elevation (NSTEMI) myocardial infarction [facility])   Angina pectoris (HCC)    Diagnostic Studies/Procedures    08/31/23 TTE  IMPRESSIONS     1. Left ventricular ejection fraction, by estimation, is 70 to 75%. The  left ventricle has hyperdynamic function. The left ventricle has no  regional wall motion abnormalities. Left ventricular diastolic parameters  were normal.   2. Right ventricular systolic function is normal. The right ventricular  size is normal. There is normal pulmonary artery systolic pressure.   3. The mitral valve is normal in structure. Trivial mitral valve  regurgitation. No evidence of mitral stenosis.   4. The aortic valve is tricuspid. There is mild calcification of the  aortic valve. Aortic valve regurgitation is mild to moderate. No aortic  stenosis is present.   5. Aortic dilatation noted. There is borderline dilatation of the  ascending aorta, measuring 37 mm.   6. The inferior vena cava is normal in size with greater than 50%  respiratory variability, suggesting right atrial pressure of 3 mmHg.   Comparison(s): No prior Echocardiogram.   FINDINGS   Left Ventricle: Left ventricular ejection fraction, by estimation, is 70  to 75%. The left ventricle has hyperdynamic function. The left ventricle  has no regional wall motion abnormalities. The left ventricular internal  cavity size was normal in size.  There is no left ventricular hypertrophy. Left ventricular diastolic  parameters were normal.   Right Ventricle: The right ventricular size is normal. No increase in  right ventricular wall  thickness. Right ventricular systolic function is  normal. There is normal pulmonary artery systolic pressure. The tricuspid  regurgitant velocity is 2.29 m/s, and   with an assumed right atrial pressure of 3 mmHg, the estimated right  ventricular systolic pressure is 24.0 mmHg.   Left Atrium: Left atrial size was normal in size.   Right Atrium: Right atrial size was normal in size.   Pericardium: There is no evidence of pericardial effusion.   Mitral Valve: The mitral valve is normal in structure. Trivial mitral  valve regurgitation. No evidence of mitral valve stenosis.   Tricuspid Valve: The tricuspid valve is normal in structure. Tricuspid  valve regurgitation is mild . No evidence of tricuspid stenosis.   Aortic Valve: The aortic valve is tricuspid. There is mild calcification  of the aortic valve. Aortic valve regurgitation is mild to moderate.  Aortic regurgitation PHT measures 877 msec. No aortic stenosis is present.  Aortic valve mean gradient measures  5.0 mmHg. Aortic valve peak gradient measures 8.8 mmHg. Aortic valve area,  by VTI measures 2.72 cm.   Pulmonic Valve: The pulmonic valve was normal in structure. Pulmonic valve  regurgitation is trivial. No evidence of pulmonic stenosis.   Aorta: The aortic root is normal in size and structure and aortic  dilatation noted. There is borderline dilatation of the ascending aorta,  measuring 37 mm.   Venous: The inferior vena cava is normal in size with greater than 50%  respiratory variability, suggesting right atrial pressure of 3 mmHg.   IAS/Shunts: No atrial level shunt detected by color flow Doppler.  09/01/23 LHC  Conclusions: Multivessel coronary artery disease, as detailed below.  Potential culprit lesions for the patient NSTEMI were felt to be 99% mid RCA and 90% D1 stenoses. Normal left ventricular filling pressure (LVEDP 12 mmHg). Successful PCI to mid RCA using Synergy 2.5 x 24 mm drug-eluting stent with  0% residual stenosis and TIMI-3 flow. Successful PCI to D1 using Onyx Frontier 2.0 x 15 mm drug-eluting stent with 10% residual stenosis and TIMI-3 flow.   Recommendations: Continue cangrelor infusion for 2 hours from time of ticagrelor load.  Dual antiplatelet therapy with aspirin and ticagrelor for at least 12 months (if symptomatic bradycardia is a problem despite addressing other reversible causes like timolol use, transition from ticagrelor to clopidogrel may need to be considered). Aggressive secondary prevention of coronary artery disease. Medical management of distal LAD, ramus intermedius, and rPDA disease.  Diagnostic Dominance: Right  Intervention   _____________   History of Present Illness     Stephen Garcia is a 82 y.o. male with a significant past medical history of hypothyroidism, pre-diabetes, and hypercholesteremia. He was admitted on 08/30/23 for evaluation of chest pain. On day of admission, patient reported chest tightness (central and nonradiating) while cutting his grass.  He also developed nausea which prompted his spouse to call emergency medical services.  Initially patient's chest pain resolved and he did not proceed to the emergency department with EMS.  However shortly after departure of EMS his chest recurred and patient did proceed to the emergency department with EMS at this time.    Hospital Course     Consultants: N/A   Non-ST elevation myocardial infarction  In the emergency department patient's ECG was without acute ischemia or infarct.  His initial high-sensitivity troponin was 340 and trended to 908.  Chest CT without pulmonary embolism.  Patient placed on heparin infusion. Over the weekend, patient with ongoing chest discomfort prompting initiation of nitroglycerin infusion. LHC found multivessel coronary artery disease, as detailed above.  Potential culprit lesions for the patient NSTEMI were felt to be 99% mid RCA and 90% D1 stenoses. Successful PCI to  mid RCA using Synergy 2.5 x 24 mm drug-eluting stent with 0% residual stenosis and TIMI-3 flow. Successful PCI to D1 using Onyx Frontier 2.0 x 15 mm drug-eluting stent with 10% residual stenosis and TIMI-3 flow. Echocardiogram with normal/hyperdynamic LV function.   DAPT with ASA/Ticagrelor for at least 12 months No beta blocker with bradycardia. PRN nitroglycerin   Hyperlipidemia  Patient on Atorvastatin 20mg  PTA. Lipid panel as below. Given multivessel disease, LDL goal <55. Continue Atorvastatin 40mg . Given LDL goal and LPA of 92.2, will refer to lipid pharmD for evaluation of increased statin dosing/addition of Zetia vs PCSK9.   Lab Results  Component Value Date   CHOL 139 08/31/2023   HDL 53 08/31/2023   LDLCALC 80 08/31/2023   TRIG 31 08/31/2023   CHOLHDL 2.6 08/31/2023   Hypothyroidism  Continue home Levothyroxine .  Chronic angle closure glaucoma  Patient s/p right eye tube placement in September. DAPT as above. Patient with intra-cath bradycardia and interventional cardiologist suggested patient discuss dosing of intra-ocular beta blockers.         Did the patient have an acute coronary syndrome (MI, NSTEMI, STEMI, etc) this admission?:  Yes                               AHA/ACC ACS Clinical Performance & Quality Measures: Aspirin  prescribed? - Yes ADP Receptor Inhibitor (Plavix/Clopidogrel, Brilinta/Ticagrelor or Effient/Prasugrel) prescribed (includes medically managed patients)? - Yes Beta Blocker prescribed? - No. The patient has an EF >/= 50%. Based upon the [facility] Study pub in Apr 2024, there is no benefit in patients with preserved EF post MI. High Intensity Statin (Lipitor 40-80mg  or Crestor 20-40mg ) prescribed? - Yes EF assessed during THIS hospitalization? - Yes For EF <40%, was ACEI/ARB prescribed? - Not Applicable (EF >/= 40%) For EF <40%, Aldosterone Antagonist (Spironolactone or Eplerenone) prescribed? - Not Applicable (EF >/= 40%) Cardiac  Rehab Phase II ordered (including medically managed patients)? - Yes       The patient will be scheduled for a TOC follow up appointment in 7-14 days.  A message has been sent to the [facility] and Scheduling Pool at the office where the patient should be seen for follow up.  _____________  Discharge Vitals Blood pressure 122/60, pulse 66, temperature 97.9 F (36.6 C), temperature source Oral, resp. rate 16, height 5\' 4"  (1.626 m), weight 77.4 kg, SpO2 96%.  Filed Weights   08/30/23 1900 09/01/23 0503  Weight: 78.9 kg 77.4 kg   Physical Exam Vitals reviewed.  Constitutional:      Appearance: Normal appearance.  HENT:     Head: Normocephalic.  Eyes:     Pupils: Pupils are equal, round, and reactive to light.  Cardiovascular:     Rate and Rhythm: Normal rate and regular rhythm.     Pulses: Normal pulses.     Heart sounds: Normal heart sounds.  Pulmonary:     Effort: Pulmonary effort is normal.     Breath sounds: Normal breath sounds.  Abdominal:     General: Abdomen is flat.     Palpations: Abdomen is soft.  Musculoskeletal:     Right lower leg: No edema.     Left lower leg: No edema.  Skin:    General: Skin is warm and dry.     Capillary Refill: Capillary refill takes less than 2 seconds.     Comments: Right radial access site stable with no bleeding, swelling, hematoma.  Neurological:     General: No focal deficit present.     Mental Status: He is alert and oriented to person, place, and time.  Psychiatric:        Mood and Affect: Mood normal.        Behavior: Behavior normal.        Thought Content: Thought content normal.        Judgment: Judgment normal.      Labs & Radiologic Studies    CBC Recent Labs    09/01/23 0304 09/02/23 0613  WBC 6.7 6.0  HGB 11.8* 12.3*  HCT 35.0* 36.5*  MCV 89.7 91.3  PLT 140* 148*   Basic Metabolic Panel Recent Labs    16/10/96 0314 09/02/23 0613  NA 137 137  K 3.8 3.3*  CL 106 106  CO2 22 20*  GLUCOSE 117* 83   BUN 12 8  CREATININE 0.88 0.90  CALCIUM 9.3 8.7*   Liver Function Tests Recent Labs    08/30/23 1742  AST 22  ALT 16  ALKPHOS 38  BILITOT 0.7  PROT 6.7  ALBUMIN 3.6   No results for input(s): "LIPASE", "AMYLASE" in the last 72 hours. High Sensitivity Troponin:   Recent Labs  Lab 08/30/23 1742 08/30/23 1928  TROPONINIHS 340* 908*    BNP Invalid input(s): "POCBNP" D-Dimer Recent Labs    08/30/23 1934  DDIMER 0.94*   Hemoglobin A1C Recent Labs    08/31/23 0314  HGBA1C 6.0*   Fasting Lipid Panel Recent Labs    08/31/23 0317  CHOL 139  HDL 53  LDLCALC 80  TRIG 31  CHOLHDL 2.6   Thyroid Function Tests No results for input(s): "TSH", "T4TOTAL", "T3FREE", "THYROIDAB" in the last 72 hours.  Invalid input(s): "FREET3" _____________  CARDIAC CATHETERIZATION  Result Date: 09/01/2023 Conclusions: Multivessel coronary artery disease, as detailed below.  Potential culprit lesions for the patient NSTEMI were felt to be 99% mid RCA and 90% D1 stenoses. Normal left ventricular filling pressure (LVEDP 12 mmHg). Successful PCI to mid RCA using Synergy 2.5 x 24 mm drug-eluting stent with 0% residual stenosis and TIMI-3 flow. Successful PCI to D1 using Onyx Frontier 2.0 x 15 mm drug-eluting stent with 10% residual stenosis and TIMI-3 flow. Recommendations: Continue cangrelor infusion for 2 hours from time of ticagrelor load.  Dual antiplatelet therapy with aspirin and ticagrelor for at least 12 months (if symptomatic bradycardia is a problem despite addressing other reversible causes like timolol use, transition from ticagrelor to clopidogrel may need to be considered). Aggressive secondary prevention of coronary artery disease. Medical management of distal LAD, ramus intermedius, and rPDA disease. Yvonne Kendall, MD [facility]  ECHOCARDIOGRAM COMPLETE  Result Date: 08/31/2023    ECHOCARDIOGRAM REPORT   Patient Name:   Stephen Garcia Date of Exam: 08/31/2023 Medical Rec #:   [id number]   Height:       64.0 in Accession #:    [id number]  Weight:       173.9 lb Date of Birth:  1941/09/27    BSA:          1.844 m Patient Age:    82 years    BP:           108/53 mmHg Patient Gender: M           HR:           50 bpm. Exam Location:  Inpatient Procedure: 2D Echo, Cardiac Doppler and Color Doppler Indications:    Chest Pain  History:        Patient has no prior history of Echocardiogram examinations.  Sonographer:    Darlys Gales Referring Phys: [id number] KAMAL H HENDERSON IMPRESSIONS  1. Left ventricular ejection fraction, by estimation, is 70 to 75%. The left ventricle has hyperdynamic function. The left ventricle has no regional wall motion abnormalities. Left ventricular diastolic parameters were normal.  2. Right ventricular systolic function is normal. The right ventricular size is normal. There is normal pulmonary artery systolic pressure.  3. The mitral valve is normal in structure. Trivial mitral valve regurgitation. No evidence of mitral stenosis.  4. The aortic valve is tricuspid. There is mild calcification of the aortic valve. Aortic valve regurgitation is mild to moderate. No aortic stenosis is present.  5. Aortic dilatation noted. There is borderline dilatation of the ascending aorta, measuring 37 mm.  6. The inferior vena cava is normal in size with greater than 50% respiratory variability, suggesting right atrial pressure of 3 mmHg. Comparison(s): No prior Echocardiogram. FINDINGS  Left Ventricle: Left ventricular ejection fraction, by estimation, is 70 to 75%. The left ventricle has hyperdynamic function. The left ventricle has no regional wall motion abnormalities. The left ventricular internal cavity size was normal in size. There is no left ventricular hypertrophy. Left ventricular diastolic parameters were normal. Right Ventricle: The right ventricular size is normal. No increase in right  ventricular wall thickness. Right ventricular systolic function is normal. There is  normal pulmonary artery systolic pressure. The tricuspid regurgitant velocity is 2.29 m/s, and  with an assumed right atrial pressure of 3 mmHg, the estimated right ventricular systolic pressure is 24.0 mmHg. Left Atrium: Left atrial size was normal in size. Right Atrium: Right atrial size was normal in size. Pericardium: There is no evidence of pericardial effusion. Mitral Valve: The mitral valve is normal in structure. Trivial mitral valve regurgitation. No evidence of mitral valve stenosis. Tricuspid Valve: The tricuspid valve is normal in structure. Tricuspid valve regurgitation is mild . No evidence of tricuspid stenosis. Aortic Valve: The aortic valve is tricuspid. There is mild calcification of the aortic valve. Aortic valve regurgitation is mild to moderate. Aortic regurgitation PHT measures 877 msec. No aortic stenosis is present. Aortic valve mean gradient measures 5.0 mmHg. Aortic valve peak gradient measures 8.8 mmHg. Aortic valve area, by VTI measures 2.72 cm. Pulmonic Valve: The pulmonic valve was normal in structure. Pulmonic valve regurgitation is trivial. No evidence of pulmonic stenosis. Aorta: The aortic root is normal in size and structure and aortic dilatation noted. There is borderline dilatation of the ascending aorta, measuring 37 mm. Venous: The inferior vena cava is normal in size with greater than 50% respiratory variability, suggesting right atrial pressure of 3 mmHg. IAS/Shunts: No atrial level shunt detected by color flow Doppler.  LEFT VENTRICLE PLAX 2D LVIDd:         4.60 cm   Diastology LVIDs:         3.00 cm   LV e' medial:    6.64 cm/s LV PW:         1.10 cm   LV E/e' medial:  12.5 LV IVS:        1.00 cm   LV e' lateral:   8.49 cm/s LVOT diam:     1.90 cm   LV E/e' lateral: 9.8 LV SV:         86 LV SV Index:   47 LVOT Area:     2.84 cm  RIGHT VENTRICLE RV S prime:     12.30 cm/s TAPSE (M-mode): 2.9 cm LEFT ATRIUM             Index        RIGHT ATRIUM           Index LA Vol (A2C):    39.9 ml 21.64 ml/m  RA Area:     13.30 cm LA Vol (A4C):   37.9 ml 20.56 ml/m  RA Volume:   32.20 ml  17.47 ml/m LA Biplane Vol: 39.6 ml 21.48 ml/m  AORTIC VALVE AV Area (Vmax):    2.70 cm AV Area (Vmean):   2.38 cm AV Area (VTI):     2.72 cm AV Vmax:           148.00 cm/s AV Vmean:          97.700 cm/s AV VTI:            0.316 m AV Peak Grad:      8.8 mmHg AV Mean Grad:      5.0 mmHg LVOT Vmax:         141.00 cm/s LVOT Vmean:        82.000 cm/s LVOT VTI:          0.303 m LVOT/AV VTI ratio: 0.96 AI PHT:            877 msec  AORTA  Ao Root diam: 3.30 cm Ao Asc diam:  3.70 cm MITRAL VALVE               TRICUSPID VALVE MV Area (PHT): 4.60 cm    TR Peak grad:   21.0 mmHg MV Decel Time: 165 msec    TR Vmax:        229.00 cm/s MV E velocity: 83.00 cm/s MV A velocity: 76.70 cm/s  SHUNTS MV E/A ratio:  1.08        Systemic VTI:  0.30 m                            Systemic Diam: 1.90 cm Vishnu Priya Mallipeddi Electronically signed by Winfield Rast Mallipeddi Signature Date/Time: 08/31/2023/12:17:52 PM    Final    CT Angio Chest PE W and/or Wo Contrast  Result Date: 08/30/2023 CLINICAL DATA:  Chest pain and shortness of breath EXAM: CT ANGIOGRAPHY CHEST WITH CONTRAST TECHNIQUE: Multidetector CT imaging of the chest was performed using the standard protocol during bolus administration of intravenous contrast. Multiplanar CT image reconstructions and MIPs were obtained to evaluate the vascular anatomy. RADIATION DOSE REDUCTION: This exam was performed according to the departmental dose-optimization program which includes automated exposure control, adjustment of the mA and/or kV according to patient size and/or use of iterative reconstruction technique. CONTRAST:  75mL OMNIPAQUE IOHEXOL 350 MG/ML SOLN COMPARISON:  Chest x-ray from earlier in the same day, 02/25/2023, 12/17/2022. FINDINGS: Cardiovascular: Atherosclerotic calcifications of the thoracic aorta are noted. No aneurysmal dilatation is seen. No cardiac  enlargement is noted. Pulmonary artery is well visualized within normal branching pattern. No filling defect to suggest pulmonary embolism is noted. Coronary calcifications are seen. Mediastinum/Nodes: Thoracic inlet is within normal limits. The esophagus as visualized is within normal limits. No hilar or mediastinal adenopathy is seen. Lungs/Pleura: Lungs are well aerated bilaterally. Mild emphysematous changes are noted. Nodularity is noted in the left lung base similar to that seen on the prior exam. There is slight increase in size up to 1.6 cm in the transverse imaging. This may simply represent persistent scarring. No other focal nodularity is noted. Upper Abdomen: No acute abnormality. Musculoskeletal: No acute bony abnormality is noted. Review of the MIP images confirms the above findings. IMPRESSION: No evidence of pulmonary emboli. Slight increase in the size of the nodular area in the left lateral lower lobe when compared with the prior exam. Follow-up in 3-6 months is recommended to assess for stability. Aortic Atherosclerosis (ICD10-I70.0) and Emphysema (ICD10-J43.9). Electronically Signed   By: Alcide Clever M.D.   On: 08/30/2023 23:09   DG Chest 2 View  Result Date: 08/30/2023 CLINICAL DATA:  Substernal chest pain, nausea, diaphoresis. EXAM: CHEST - 2 VIEW COMPARISON:  Chest CT without contrast 02/25/2023 FINDINGS: There is mild cardiomegaly. Mild aortic ectasia and atherosclerosis with stable mediastinum. The central vessels are mildly prominent, without overt interstitial edema. There are mild chronic interstitial changes in both bases without focal pneumonia or pleural effusion. Thoracic cage is intact with osteopenia. IMPRESSION: 1. Mild central vascular prominence without overt edema. No other evidence of acute chest process. 2. Mild cardiomegaly and aortic atherosclerosis. Electronically Signed   By: Almira Bar M.D.   On: 08/30/2023 20:44   Disposition   Pt is being discharged home  today in good condition. He was seen by Dr. Flora Lipps and deemed stable for discharge.   Follow-up Plans & Appointments     Discharge Instructions  AMB Referral to Cardiac Rehabilitation - Phase II   Complete by: As directed    Diagnosis:  NSTEMI Coronary Stents     After initial evaluation and assessments completed: Virtual Based Care may be provided alone or in conjunction with Phase 2 Cardiac Rehab based on patient barriers.: Yes   Intensive Cardiac Rehabilitation (ICR) [facility] location only OR Traditional Cardiac Rehabilitation (TCR) *If criteria for ICR are not met will enroll in TCR [facility] only): Yes        Discharge Medications   Allergies as of 09/02/2023       Reactions   Fluticasone-salmeterol Itching        Medication List     TAKE these medications    acetaminophen 650 MG CR tablet Commonly known as: TYLENOL Take 650 mg by mouth once as needed for pain.   aspirin EC 81 MG tablet Take 1 tablet (81 mg total) by mouth daily. Swallow whole. Start taking on: September 03, 2023   atorvastatin 40 MG tablet Commonly known as: LIPITOR Take 1 tablet (40 mg total) by mouth daily. Start taking on: September 03, 2023 What changed:  medication strength how much to take   brimonidine 0.1 % Soln Commonly known as: ALPHAGAN P Place 1 drop into the left eye in the morning, at noon, and at bedtime.   dorzolamide-timolol 2-0.5 % ophthalmic solution Commonly known as: COSOPT Place 1 drop into both eyes in the morning, at noon, and at bedtime.   ergocalciferol 1.25 MG (50000 UT) capsule Commonly known as: VITAMIN D2 Take 50,000 Units by mouth once a week.   levothyroxine 112 MCG tablet Commonly known as: SYNTHROID Take 1 tablet (112 mcg total) by mouth daily.   nitroGLYCERIN 0.4 MG SL tablet Commonly known as: NITROSTAT Place 1 tablet (0.4 mg total) under the tongue every 5 (five) minutes x 3 doses as needed for chest pain.   pilocarpine 1 % ophthalmic  solution Commonly known as: PILOCAR Place 1 drop into the left eye 4 (four) times daily.   Rocklatan 0.02-0.005 % Soln Generic drug: Netarsudil-Latanoprost Place 1 drop into both eyes at bedtime.   ticagrelor 90 MG Tabs tablet Commonly known as: BRILINTA Take 1 tablet (90 mg total) by mouth 2 (two) times daily.           Outstanding Labs/Studies    Duration of Discharge Encounter   Greater than 30 minutes including physician time.  Con Memos, PA-C 09/02/2023, 8:43 AM

## 2023-09-02 NOTE — Progress Notes (Signed)
CARDIAC REHAB PHASE I   PRE:  Rate/Rhythm: 66 SR  BP:  Sitting: 122/60      SaO2: 98 RA  MODE:  Ambulation: 400 ft   POST:  Rate/Rhythm: 77 SR  BP:  Sitting: 131/65      SaO2: 98 RA  Pt ambulated independently in hallway, tolerated well with no CP, SOB or dizziness. Returned to bed with call bell and bedside table in reach. Post MI/stent education including restrictions, risk factors, exercise guidelines, antiplatelet therapy importance, MI booklet, NTG use, heart healthy diet and CRP2 reviewed. All questions and concerns addressed. Will refer to [facility] for CRP2. Plan for home later today.   [id number] Woodroe Chen, RN BSN 09/02/2023 9:18 AM

## 2023-09-02 NOTE — Telephone Encounter (Signed)
Currently admitted. Placed as TOC for 10/23

## 2023-09-03 NOTE — Progress Notes (Signed)
Cardiology Office Note:  .   Date:  09/09/2023  ID:  [name], DOB [birth year], MRN [id number] PCP: Tally Joe, MD  [facility] Providers Cardiologist:  Reatha Harps, MD   History of Present Illness: .   HOAN ROUGH is a 82 y.o. male with a past medical history of CAD with recent NSTEMI, HLD, Hypothyroidism, chronic angle closure glaucoma. He is followed by Dr. Flora Lipps and presents today for follow up after recent NSTEM.   Patient presented to the ED at an outside facility on 10/19 complaining of chest pain. EKG in the ED was without ischemia/infarct. HsTn 340, 908. CTA chest ruled out PE. Patient was started on IV heparin and transferred to [facility] for further cardiac workup. Underwent echocardiogram 08/31/23 that showed EF 70-75%, no regional wall motion abnormalities, normal RV function, normal pulmonary artery systolic pressure, mild-moderate aortic valve regurgitation, borderline dilatation of the ascending aorta measuring 37 mm. Underwent cardiac catheterization on 10/21 that showed multivessel CAD. The culprit lesions felt to be 99% mid RCA and 90% D1 stenosis. Underwent successful PCI of the mid RCA with DES and successful PCI to D1 with DES. Recommended DAPT with ASA, ticagrelor for at least 12 months. Patient was noted to have bradycardia, and his timolol eyedrops were discontinued. Lipitor was increased to 40 mg daily   Today, patient presents for a follow-up visit after recent NSTEMI. The patient denies chest pain or difficulty breathing since his catheterization. He denies any symptoms of dizziness or fatigue. The patient has been adhering to the prescribed medication regimen, which includes aspirin, Brilinta, and nitroglycerin as needed for chest pain. He expresses understanding of the nitroglycerin administration instructions and the importance of the blood thinners in preventing stent occlusion.  The patient reports intermittent swelling in his feet, which has been a  long-standing issue. The swelling is described as randomly coming and going, sometimes extending from the ankle to the big toe. The patient denies any new swelling in the ankles. He reports a history of exercising regularly, including walking and doing push-ups and sit-ups at home. He expresses a desire to resume these activities.  The patient's wrist, where the heart catheterization was performed, appears to be healing well with no signs of infection or complications. The patient is advised that he can gradually resume using the wrist for lifting objects. The patient's blood pressure is reported to be within normal limits during the visit. The patient's cholesterol medication was adjusted during his hospital stay, and he reports no adverse effects from this change.   ROS: Patient denies chest pain, palpitations, shortness of breath, syncope, near syncope. Denies bleeding on DAPT. Denies pain near radial cath site   Studies Reviewed: .   Cardiac Studies & Procedures   CARDIAC CATHETERIZATION  CARDIAC CATHETERIZATION 09/01/2023  Narrative Conclusions: Multivessel coronary artery disease, as detailed below.  Potential culprit lesions for the patient NSTEMI were felt to be 99% mid RCA and 90% D1 stenoses. Normal left ventricular filling pressure (LVEDP 12 mmHg). Successful PCI to mid RCA using Synergy 2.5 x 24 mm drug-eluting stent with 0% residual stenosis and TIMI-3 flow. Successful PCI to D1 using Onyx Frontier 2.0 x 15 mm drug-eluting stent with 10% residual stenosis and TIMI-3 flow.  Recommendations: Continue cangrelor infusion for 2 hours from time of ticagrelor load.  Dual antiplatelet therapy with aspirin and ticagrelor for at least 12 months (if symptomatic bradycardia is a problem despite addressing other reversible causes like timolol use, transition from ticagrelor  to clopidogrel may need to be considered). Aggressive secondary prevention of coronary artery disease. Medical management  of distal LAD, ramus intermedius, and rPDA disease.  Yvonne Kendall, MD [facility]  Findings Coronary Findings Diagnostic  Dominance: Right  Left Main Vessel is large. Vessel is angiographically normal.  Left Anterior Descending Vessel is large. Ost LAD to Prox LAD lesion is 30% stenosed. The lesion is severely calcified. Dist LAD lesion is 80% stenosed.  First Diagonal Branch Vessel is moderate in size. 1st Diag lesion is 90% stenosed.  Ramus Intermedius Vessel is large.  Lateral Ramus Intermedius Lat Ramus lesion is 65% stenosed.  Left Circumflex Ost Cx lesion is 30% stenosed. Mid Cx lesion is 30% stenosed.  First Obtuse Marginal Branch Vessel is small in size. There is moderate disease in the vessel.  Second Obtuse Marginal Branch Vessel is moderate in size. There is mild disease in the vessel.  Third Obtuse Marginal Branch Vessel is small in size.  Right Coronary Artery Vessel is moderate in size. Prox RCA-1 lesion is 50% stenosed. Prox RCA-2 lesion is 30% stenosed. Mid RCA to Dist RCA lesion is 99% stenosed.  Right Posterior Descending Artery Vessel is small in size. Collaterals RPDA filled by collaterals from 2nd Sept.  RPDA lesion is 100% stenosed. The lesion is chronically occluded.  Right Posterior Atrioventricular Artery Vessel is small in size.  First Right Posterolateral Branch Vessel is small in size.  Second Right Posterolateral Branch Vessel is small in size.  Third Right Posterolateral Branch Vessel is small in size.  Intervention  1st Diag lesion Stent CATH VISTA GUIDE 6FR XBLAD3.5 guide catheter was inserted. Pre-stent angioplasty was performed using a BALLN EMERGE MR 2.0X12. Maximum pressure:  6 atm. A drug-eluting stent was successfully placed using a STENT ONYX FRONTIER 2.0X15. Maximum pressure: 14 atm. Post-stent angioplasty was performed using a BALLN [place] EMERGE MR [id number]. Maximum pressure:  20 atm. Post-Intervention  Lesion Assessment The intervention was successful. Pre-interventional TIMI flow is 3. Post-intervention TIMI flow is 3. No complications occurred at this lesion. There is a 10% residual stenosis post intervention.  Mid RCA to Dist RCA lesion Stent CATH VISTA GUIDE 6FR JR4 guide catheter was inserted. Lesion crossed with guidewire using a WIRE RUNTHROUGH IZANAI 014 180. Pre-stent angioplasty was performed using a BALLN EMERGE MR 2.0X12. Maximum pressure:  12 atm. A drug-eluting stent was successfully placed using a SYNERGY XD 2.50X24. Maximum pressure: 14 atm. Post-stent angioplasty was performed using a BALLN  EMERGE MR [id number]. Maximum pressure:  16 atm. Post-Intervention Lesion Assessment The intervention was successful. Pre-interventional TIMI flow is 1. Post-intervention TIMI flow is 3. No complications occurred at this lesion. There is a 0% residual stenosis post intervention.     ECHOCARDIOGRAM  ECHOCARDIOGRAM COMPLETE 08/31/2023  Narrative ECHOCARDIOGRAM REPORT    Patient Name:   SPERO WEHE Date of Exam: 08/31/2023 Medical Rec #:  [id number]   Height:       64.0 in Accession #:    [id number]  Weight:       173.9 lb Date of Birth:  [birth year]    BSA:          1.844 m Patient Age:    82 years    BP:           108/53 mmHg Patient Gender: M           HR:           50 bpm. Exam Location:  Inpatient  Procedure: 2D  Echo, Cardiac Doppler and Color Doppler  Indications:    Chest Pain  History:        Patient has no prior history of Echocardiogram examinations.  Sonographer:    Darlys Gales Referring Phys: [id number] KAMAL H HENDERSON  IMPRESSIONS   1. Left ventricular ejection fraction, by estimation, is 70 to 75%. The left ventricle has hyperdynamic function. The left ventricle has no regional wall motion abnormalities. Left ventricular diastolic parameters were normal. 2. Right ventricular systolic function is normal. The right ventricular size is normal. There is normal  pulmonary artery systolic pressure. 3. The mitral valve is normal in structure. Trivial mitral valve regurgitation. No evidence of mitral stenosis. 4. The aortic valve is tricuspid. There is mild calcification of the aortic valve. Aortic valve regurgitation is mild to moderate. No aortic stenosis is present. 5. Aortic dilatation noted. There is borderline dilatation of the ascending aorta, measuring 37 mm. 6. The inferior vena cava is normal in size with greater than 50% respiratory variability, suggesting right atrial pressure of 3 mmHg.  Comparison(s): No prior Echocardiogram.  FINDINGS Left Ventricle: Left ventricular ejection fraction, by estimation, is 70 to 75%. The left ventricle has hyperdynamic function. The left ventricle has no regional wall motion abnormalities. The left ventricular internal cavity size was normal in size. There is no left ventricular hypertrophy. Left ventricular diastolic parameters were normal.  Right Ventricle: The right ventricular size is normal. No increase in right ventricular wall thickness. Right ventricular systolic function is normal. There is normal pulmonary artery systolic pressure. The tricuspid regurgitant velocity is 2.29 m/s, and with an assumed right atrial pressure of 3 mmHg, the estimated right ventricular systolic pressure is 24.0 mmHg.  Left Atrium: Left atrial size was normal in size.  Right Atrium: Right atrial size was normal in size.  Pericardium: There is no evidence of pericardial effusion.  Mitral Valve: The mitral valve is normal in structure. Trivial mitral valve regurgitation. No evidence of mitral valve stenosis.  Tricuspid Valve: The tricuspid valve is normal in structure. Tricuspid valve regurgitation is mild . No evidence of tricuspid stenosis.  Aortic Valve: The aortic valve is tricuspid. There is mild calcification of the aortic valve. Aortic valve regurgitation is mild to moderate. Aortic regurgitation PHT measures 877  msec. No aortic stenosis is present. Aortic valve mean gradient measures 5.0 mmHg. Aortic valve peak gradient measures 8.8 mmHg. Aortic valve area, by VTI measures 2.72 cm.  Pulmonic Valve: The pulmonic valve was normal in structure. Pulmonic valve regurgitation is trivial. No evidence of pulmonic stenosis.  Aorta: The aortic root is normal in size and structure and aortic dilatation noted. There is borderline dilatation of the ascending aorta, measuring 37 mm.  Venous: The inferior vena cava is normal in size with greater than 50% respiratory variability, suggesting right atrial pressure of 3 mmHg.  IAS/Shunts: No atrial level shunt detected by color flow Doppler.   LEFT VENTRICLE PLAX 2D LVIDd:         4.60 cm   Diastology LVIDs:         3.00 cm   LV e' medial:    6.64 cm/s LV PW:         1.10 cm   LV E/e' medial:  12.5 LV IVS:        1.00 cm   LV e' lateral:   8.49 cm/s LVOT diam:     1.90 cm   LV E/e' lateral: 9.8 LV SV:  86 LV SV Index:   47 LVOT Area:     2.84 cm   RIGHT VENTRICLE RV S prime:     12.30 cm/s TAPSE (M-mode): 2.9 cm  LEFT ATRIUM             Index        RIGHT ATRIUM           Index LA Vol (A2C):   39.9 ml 21.64 ml/m  RA Area:     13.30 cm LA Vol (A4C):   37.9 ml 20.56 ml/m  RA Volume:   32.20 ml  17.47 ml/m LA Biplane Vol: 39.6 ml 21.48 ml/m AORTIC VALVE AV Area (Vmax):    2.70 cm AV Area (Vmean):   2.38 cm AV Area (VTI):     2.72 cm AV Vmax:           148.00 cm/s AV Vmean:          97.700 cm/s AV VTI:            0.316 m AV Peak Grad:      8.8 mmHg AV Mean Grad:      5.0 mmHg LVOT Vmax:         141.00 cm/s LVOT Vmean:        82.000 cm/s LVOT VTI:          0.303 m LVOT/AV VTI ratio: 0.96 AI PHT:            877 msec  AORTA Ao Root diam: 3.30 cm Ao Asc diam:  3.70 cm  MITRAL VALVE               TRICUSPID VALVE MV Area (PHT): 4.60 cm    TR Peak grad:   21.0 mmHg MV Decel Time: 165 msec    TR Vmax:        229.00 cm/s MV E  velocity: 83.00 cm/s MV A velocity: 76.70 cm/s  SHUNTS MV E/A ratio:  1.08        Systemic VTI:  0.30 m Systemic Diam: 1.90 cm  Vishnu Priya Mallipeddi Electronically signed by Winfield Rast Mallipeddi Signature Date/Time: 08/31/2023/12:17:52 PM    Final             Risk Assessment/Calculations:             Physical Exam:   VS:  BP (!) 100/56 (BP Location: Left Arm, Patient Position: Sitting, Cuff Size: Normal)   Pulse (!) 53   Ht 5\' 4"  (1.626 m)   Wt 171 lb (77.6 kg)   SpO2 95%   BMI 29.35 kg/m    Wt Readings from Last 3 Encounters:  09/09/23 171 lb (77.6 kg)  09/01/23 170 lb 9.6 oz (77.4 kg)  07/01/23 174 lb (78.9 kg)    GEN: Well nourished, well developed in no acute distress. Sitting comfortably in the chair  NECK: No JVD CARDIAC: RRR, no murmurs, rubs, gallops. Radial pulses 2+ bilaterally. Right radial cath site soft, nontender.  RESPIRATORY:  Clear to auscultation without rales, wheezing or rhonchi. Normal work of breathing on room air  ABDOMEN: Soft, non-tender, non-distended EXTREMITIES:  No edema in BLE; No deformity   ASSESSMENT AND PLAN: .   NSTEMI  CAD  - Recently admitted from 10/19-10/22 with NSTEMI. Underwent LHC on 10/21 and was found to have multivessel disease. Successful PCI/DES of the mid RCA and D1. Recommended medical management of distal LAD (80%), ramus intermedius (65%), and rPDA disease (100%, CTO) - Echocardiogram that admission  showed EF 70-75% with no regional WMA - Since discharge, patient reports that he has been doing very well. Denies chest pain, DOE. Right radial cath site is stable and healing as expected. No bleeding or bruising on DAPT  - EKG today showed sinus bradycardia (HR 53), no ischemic changes  - Not on BB due to bradycardia  - Continue ASA 81 mg daily, Brilinta 90 mg BID - Continue lipitor  - Ok for patient to slowly increase physical exertion as tolerated   HLD  - Lipid panel from 08/2023 showed LDL 80. Lipoprotein (a)  92.2 - Lipitor was increased to 40 mg daily during recent admission  - Recheck LFTs, lipid panel in 8 weeks. If LDL not at goal at that time, consider referral to PharmD to discuss PCSK9i   Hypothyroidism  - Continue levothyroxine 112 mcg. Followed by PCP   Hypokalemia  - K was 3.3 prior to DC on 10/22. Patient was given supplementation prior to DC  - Ordered BMP to assess K, renal function after contrast dye   Dispo: Follow up in 6-8 weeks   Signed, Jonita Albee, PA-C

## 2023-09-04 NOTE — Telephone Encounter (Signed)
Patient contacted regarding discharge from [facility] on 09/02/23.  Patient understands to follow up with provider Laural Benes on 09/09/23 at 1:55 at [facility]. Patient understands discharge instructions? yes  Patient understands medications and regiment? yes  Patient understands to bring all medications to this visit? yes

## 2023-09-05 ENCOUNTER — Telehealth (HOSPITAL_COMMUNITY): Payer: Self-pay

## 2023-09-05 NOTE — Telephone Encounter (Signed)
Pt is not interested in the cardiac rehab program right now. Closed referral.

## 2023-09-08 ENCOUNTER — Other Ambulatory Visit: Payer: Self-pay | Admitting: Nurse Practitioner

## 2023-09-08 DIAGNOSIS — M810 Age-related osteoporosis without current pathological fracture: Secondary | ICD-10-CM

## 2023-09-08 DIAGNOSIS — E039 Hypothyroidism, unspecified: Secondary | ICD-10-CM | POA: Diagnosis not present

## 2023-09-09 ENCOUNTER — Encounter: Payer: Self-pay | Admitting: Cardiology

## 2023-09-09 ENCOUNTER — Ambulatory Visit: Payer: Medicare Other | Attending: Cardiology | Admitting: Cardiology

## 2023-09-09 VITALS — BP 100/56 | HR 53 | Ht 64.0 in | Wt 171.0 lb

## 2023-09-09 DIAGNOSIS — I209 Angina pectoris, unspecified: Secondary | ICD-10-CM | POA: Diagnosis not present

## 2023-09-09 DIAGNOSIS — I214 Non-ST elevation (NSTEMI) myocardial infarction: Secondary | ICD-10-CM

## 2023-09-09 DIAGNOSIS — E782 Mixed hyperlipidemia: Secondary | ICD-10-CM | POA: Diagnosis not present

## 2023-09-09 DIAGNOSIS — E039 Hypothyroidism, unspecified: Secondary | ICD-10-CM

## 2023-09-09 DIAGNOSIS — I2510 Atherosclerotic heart disease of native coronary artery without angina pectoris: Secondary | ICD-10-CM | POA: Diagnosis not present

## 2023-09-09 DIAGNOSIS — N210 Calculus in bladder: Secondary | ICD-10-CM | POA: Diagnosis not present

## 2023-09-09 NOTE — Patient Instructions (Addendum)
Medication Instructions:  No changes *If you need a refill on your cardiac medications before your next appointment, please call your pharmacy*   Lab Work: Today BMP  If you have labs (blood work) drawn today and your tests are completely normal, you will receive your results only by: MyChart Message (if you have MyChart) OR A paper copy in the mail If you have any lab test that is abnormal or we need to change your treatment, we will call you to review the results.   Testing/Procedures: No testing   Follow-Up: At [facility], you and your health needs are our priority.  As part of our continuing mission to provide you with exceptional heart care, we have created designated Provider Care Teams.  These Care Teams include your primary Cardiologist (physician) and Advanced Practice Providers (APPs -  Physician Assistants and Nurse Practitioners) who all work together to provide you with the care you need, when you need it.  We recommend signing up for the patient portal called "MyChart".  Sign up information is provided on this After Visit Summary.  MyChart is used to connect with patients for Virtual Visits (Telemedicine).  Patients are able to view lab/test results, encounter notes, upcoming appointments, etc.  Non-urgent messages can be sent to your provider as well.   To learn more about what you can do with MyChart, go to [email or web address].    Your next appointment:   6-8 week(s)  Provider:   Any APP

## 2023-09-10 LAB — BASIC METABOLIC PANEL
BUN/Creatinine Ratio: 12 (ref 10–24)
BUN: 12 mg/dL (ref 8–27)
CO2: 26 mmol/L (ref 20–29)
Calcium: 10 mg/dL (ref 8.6–10.2)
Chloride: 102 mmol/L (ref 96–106)
Creatinine, Ser: 0.98 mg/dL (ref 0.76–1.27)
Glucose: 104 mg/dL — ABNORMAL HIGH (ref 70–99)
Potassium: 4.7 mmol/L (ref 3.5–5.2)
Sodium: 139 mmol/L (ref 134–144)
eGFR: 77 mL/min/{1.73_m2} (ref >59)

## 2023-09-11 ENCOUNTER — Telehealth: Payer: Self-pay

## 2023-09-11 ENCOUNTER — Telehealth: Payer: Self-pay | Admitting: Cardiology

## 2023-09-11 NOTE — Telephone Encounter (Signed)
-----   Message from Jonita Albee sent at 09/10/2023  1:28 PM EDT ----- Please tell patient that his kidney function is normal after receiving contrast dye for cath. Potassium and other electrolytes normal. No changes to medications at this time   Thanks KJ

## 2023-09-11 NOTE — Telephone Encounter (Signed)
Called patient advised of below they verbalized understanding.

## 2023-09-11 NOTE — Telephone Encounter (Signed)
Called patient to inquire on their question they were curious as to when their next appointment with Dr. Okey Dupre was.  Explained that the appointment on the 28th as that f/u just not with Dr. Okey Dupre.

## 2023-09-11 NOTE — Telephone Encounter (Signed)
patient is requesting to speak with Robet Leu or nurse about appt from yesterday

## 2023-09-22 ENCOUNTER — Telehealth: Payer: Self-pay | Admitting: Cardiovascular Disease

## 2023-09-22 MED ORDER — TICAGRELOR 90 MG PO TABS: 90.0000 mg | ORAL_TABLET | Freq: Two times a day (BID) | ORAL | 3 refills | Status: DC

## 2023-09-22 MED ORDER — ASPIRIN 81 MG PO TBEC: 81.0000 mg | DELAYED_RELEASE_TABLET | Freq: Every day | ORAL | 3 refills | Status: DC

## 2023-09-22 MED ORDER — ASPIRIN 81 MG PO TBEC: 81.0000 mg | DELAYED_RELEASE_TABLET | Freq: Every day | ORAL | 12 refills | Status: DC

## 2023-09-22 NOTE — Telephone Encounter (Signed)
Prescription for ASA 81 mg and Brilinta sent to pharmacy.

## 2023-09-22 NOTE — Telephone Encounter (Signed)
*  STAT* If patient is at the pharmacy, call can be transferred to refill team.   1. Which medications need to be refilled? (please list name of each medication and dose if known) aspirin EC 81 MG tablet    ticagrelor (BRILINTA) 90 MG TABS tablet    2. Which pharmacy/location (including street and city if local pharmacy) is medication to be sent to?  [id number] - [place] ([place]), [place] - [address]    3. Do they need a 30 day or 90 day supply? 90

## 2023-09-22 NOTE — Telephone Encounter (Signed)
Orders cancelled due to concern about patient taking brilinta and aspirin

## 2023-09-22 NOTE — Telephone Encounter (Signed)
Prescriptions for Brilinta and ASA sent to [facility] pharmacy.

## 2023-09-30 ENCOUNTER — Other Ambulatory Visit (HOSPITAL_COMMUNITY): Payer: Self-pay

## 2023-10-13 NOTE — Progress Notes (Unsigned)
Cardiology Office Note:  .   Date:  10/14/2023  ID:  Stephen Garcia, DOB 09-Mar-1941, MRN [id number] PCP: Tally Joe, MD  [facility] Providers Cardiologist:  Reatha Harps, MD Cardiology APP:  Jonita Albee, PA-C { History of Present Illness: .   Stephen Garcia is a 82 y.o. male with history of CAD, HLD who presents for follow-up.    History of Present Illness   Stephen Garcia, an 82 year old male with a history of CAD, presents for follow-up after a recent non-STEMI in October 2024. He reports feeling "pretty good" and is surprised by his heart attack as he has been asymptomatic. He denies any chest pain or dyspnea. He has not been exercising much and has not started cardiac rehab due to other medical appointments. He has been dealing with glaucoma and recently had eye surgery, which has left him with stitches that are causing discomfort. He is currently on blood thinners, aspirin, and Brilinta, and has no issues with these medications. He also mentions a problem with his foot, which occasionally swells and is sore, possibly due to arthritis. No exercise due to wife's concerns given recent MI. No CP or SOB reported.          Problem List CAD  -NSTEMI 08/2023 -PCI mRCA and D1 09/01/2023 2. HLD -T chol 139, HDL 53, LDL 80, TG 31 3. Chronic angle closure glaucoma     ROS: All other ROS reviewed and negative. Pertinent positives noted in the HPI.     Studies Reviewed: Marland Kitchen   EKG Interpretation Date/Time:  Tuesday October 14 2023 14:53:37 EST Ventricular Rate:  52 PR Interval:  148 QRS Duration:  90 QT Interval:  396 QTC Calculation: 368 R Axis:   12  Text Interpretation: Sinus bradycardia Nonspecific T wave abnormality Confirmed by Lennie Odor [phone]) on 10/14/2023 2:59:33 PM    TTE 08/31/2023  1. Left ventricular ejection fraction, by estimation, is 70 to 75%. The  left ventricle has hyperdynamic function. The left ventricle has no  regional wall motion abnormalities.  Left ventricular diastolic parameters  were normal.   2. Right ventricular systolic function is normal. The right ventricular  size is normal. There is normal pulmonary artery systolic pressure.   3. The mitral valve is normal in structure. Trivial mitral valve  regurgitation. No evidence of mitral stenosis.   4. The aortic valve is tricuspid. There is mild calcification of the  aortic valve. Aortic valve regurgitation is mild to moderate. No aortic  stenosis is present.   5. Aortic dilatation noted. There is borderline dilatation of the  ascending aorta, measuring 37 mm.   6. The inferior vena cava is normal in size with greater than 50%  respiratory variability, suggesting right atrial pressure of 3 mmHg.  Physical Exam:   VS:  BP (!) 112/52 (BP Location: Left Arm, Patient Position: Sitting, Cuff Size: Normal)   Pulse (!) 52   Ht 5\' 4"  (1.626 m)   Wt 168 lb (76.2 kg)   BMI 28.84 kg/m    Wt Readings from Last 3 Encounters:  10/14/23 168 lb (76.2 kg)  09/09/23 171 lb (77.6 kg)  09/01/23 170 lb 9.6 oz (77.4 kg)    GEN: Well nourished, well developed in no acute distress NECK: No JVD; No carotid bruits CARDIAC: RRR, no murmurs, rubs, gallops RESPIRATORY:  Clear to auscultation without rales, wheezing or rhonchi  ABDOMEN: Soft, non-tender, non-distended EXTREMITIES:  No edema; No deformity  ASSESSMENT AND PLAN: .  Assessment and Plan    Coronary Artery Disease (CAD) with recent non-ST elevation myocardial infarction (NSTEMI) Asymptomatic with no angina or dyspnea. Discussed the importance of regular exercise and cardiac rehabilitation. -Continue Aspirin 81mg  and Brilinta for one year. -Encourage regular exercise and consider cardiac rehabilitation if home exercise is not sufficient. -no BB due to brdycardia.  -echo normal.   Hyperlipidemia On Lipitor 40mg . -Plan to recheck fasting lipids in the next week or two. -Continue Lipitor 40mg .  General Health Maintenance -Return  visit in six months. -Consider cardiac rehabilitation if unable to maintain regular exercise at home.              Follow-up: Return in about 6 months (around 04/13/2024).  Time Spent with Patient: I have spent a total of 35 minutes caring for this patient today face to face, ordering and reviewing labs/tests, reviewing prior records/medical history, examining the patient, establishing an assessment and plan, communicating results/findings to the patient/family, and documenting in the medical record.   Signed, Lenna Gilford. Flora Lipps, MD, [facility]  [facility]  [address], [address] [place], [place] [address] [phone]  3:43 PM

## 2023-10-14 ENCOUNTER — Ambulatory Visit: Payer: Medicare Other | Attending: Cardiovascular Disease | Admitting: Cardiovascular Disease

## 2023-10-14 ENCOUNTER — Encounter: Payer: Self-pay | Admitting: Cardiovascular Disease

## 2023-10-14 VITALS — BP 112/52 | HR 52 | Ht 64.0 in | Wt 168.0 lb

## 2023-10-14 DIAGNOSIS — E782 Mixed hyperlipidemia: Secondary | ICD-10-CM | POA: Diagnosis not present

## 2023-10-14 DIAGNOSIS — I2510 Atherosclerotic heart disease of native coronary artery without angina pectoris: Secondary | ICD-10-CM

## 2023-10-14 MED ORDER — ATORVASTATIN CALCIUM 40 MG PO TABS: 40.0000 mg | ORAL_TABLET | Freq: Every day | ORAL | 3 refills | Status: AC

## 2023-10-14 MED ORDER — TICAGRELOR 90 MG PO TABS: 90.0000 mg | ORAL_TABLET | Freq: Two times a day (BID) | ORAL | 3 refills | Status: DC

## 2023-10-14 MED ORDER — ASPIRIN 81 MG PO TBEC: 81.0000 mg | DELAYED_RELEASE_TABLET | Freq: Every day | ORAL | 3 refills | Status: AC

## 2023-10-14 NOTE — Patient Instructions (Signed)
Medication Instructions:  Your physician recommends that you continue on your current medications as directed. Please refer to the Current Medication list given to you today.    *If you need a refill on your cardiac medications before your next appointment, please call your pharmacy*   Lab Work: Your physician recommends that you return for lab work in: 1 WEEK  - Lipids     If you have labs (blood work) drawn today and your tests are completely normal, you will receive your results only by: MyChart Message (if you have MyChart) OR A paper copy in the mail If you have any lab test that is abnormal or we need to change your treatment, we will call you to review the results.   Testing/Procedures: None    Follow-Up: At [facility], you and your health needs are our priority.  As part of our continuing mission to provide you with exceptional heart care, we have created designated Provider Care Teams.  These Care Teams include your primary Cardiologist (physician) and Advanced Practice Providers (APPs -  Physician Assistants and Nurse Practitioners) who all work together to provide you with the care you need, when you need it.  We recommend signing up for the patient portal called "MyChart".  Sign up information is provided on this After Visit Summary.  MyChart is used to connect with patients for Virtual Visits (Telemedicine).  Patients are able to view lab/test results, encounter notes, upcoming appointments, etc.  Non-urgent messages can be sent to your provider as well.   To learn more about what you can do with MyChart, go to [email or web address].    Your next appointment:   1 year(s)  The format for your next appointment:   In Person  Provider:   Reatha Harps, MD    Other Instructions

## 2023-10-24 ENCOUNTER — Ambulatory Visit: Payer: Medicare Other | Admitting: Nurse Practitioner

## 2023-11-17 DIAGNOSIS — H402233 Chronic angle-closure glaucoma, bilateral, severe stage: Secondary | ICD-10-CM | POA: Diagnosis not present

## 2024-01-08 DIAGNOSIS — H402233 Chronic angle-closure glaucoma, bilateral, severe stage: Secondary | ICD-10-CM | POA: Diagnosis not present

## 2024-01-14 DIAGNOSIS — H52203 Unspecified astigmatism, bilateral: Secondary | ICD-10-CM | POA: Diagnosis not present

## 2024-01-14 DIAGNOSIS — H2512 Age-related nuclear cataract, left eye: Secondary | ICD-10-CM | POA: Diagnosis not present

## 2024-01-14 DIAGNOSIS — H40033 Anatomical narrow angle, bilateral: Secondary | ICD-10-CM | POA: Diagnosis not present

## 2024-01-14 DIAGNOSIS — H5203 Hypermetropia, bilateral: Secondary | ICD-10-CM | POA: Diagnosis not present

## 2024-01-27 ENCOUNTER — Telehealth: Payer: Self-pay | Admitting: Cardiovascular Disease

## 2024-01-27 MED ORDER — TICAGRELOR 90 MG PO TABS: 90.0000 mg | ORAL_TABLET | Freq: Two times a day (BID) | ORAL | 2 refills | Status: DC

## 2024-01-27 NOTE — Telephone Encounter (Signed)
*  STAT* If patient is at the pharmacy, call can be transferred to refill team.   1. Which medications need to be refilled? (please list name of each medication and dose if known) ticagrelor (BRILINTA) 90 MG TABS tablet   2. Which pharmacy/location (including street and city if local pharmacy) is medication to be sent to?  [id number] - [place] ([place]),  - [address]      3. Do they need a 30 day or 90 day supply? 90 day    Pt is out of medication and isn't due for a 1 year f/u until November 2025

## 2024-01-27 NOTE — Telephone Encounter (Signed)
 Pt's medication was resent to pt's preferred pharmacy as requested. Confirmation received.

## 2024-01-28 ENCOUNTER — Other Ambulatory Visit: Payer: Self-pay

## 2024-01-29 DIAGNOSIS — E559 Vitamin D deficiency, unspecified: Secondary | ICD-10-CM | POA: Diagnosis not present

## 2024-01-29 DIAGNOSIS — R053 Chronic cough: Secondary | ICD-10-CM | POA: Diagnosis not present

## 2024-01-29 DIAGNOSIS — M810 Age-related osteoporosis without current pathological fracture: Secondary | ICD-10-CM | POA: Diagnosis not present

## 2024-01-29 DIAGNOSIS — E782 Mixed hyperlipidemia: Secondary | ICD-10-CM | POA: Diagnosis not present

## 2024-01-29 DIAGNOSIS — G4486 Cervicogenic headache: Secondary | ICD-10-CM | POA: Diagnosis not present

## 2024-01-29 DIAGNOSIS — R7303 Prediabetes: Secondary | ICD-10-CM | POA: Diagnosis not present

## 2024-01-29 DIAGNOSIS — M1990 Unspecified osteoarthritis, unspecified site: Secondary | ICD-10-CM | POA: Diagnosis not present

## 2024-01-29 DIAGNOSIS — M549 Dorsalgia, unspecified: Secondary | ICD-10-CM | POA: Diagnosis not present

## 2024-01-29 DIAGNOSIS — E039 Hypothyroidism, unspecified: Secondary | ICD-10-CM | POA: Diagnosis not present

## 2024-01-29 DIAGNOSIS — I2510 Atherosclerotic heart disease of native coronary artery without angina pectoris: Secondary | ICD-10-CM | POA: Diagnosis not present

## 2024-02-23 DIAGNOSIS — H402233 Chronic angle-closure glaucoma, bilateral, severe stage: Secondary | ICD-10-CM | POA: Diagnosis not present

## 2024-03-08 DIAGNOSIS — E039 Hypothyroidism, unspecified: Secondary | ICD-10-CM | POA: Diagnosis not present

## 2024-03-08 DIAGNOSIS — M109 Gout, unspecified: Secondary | ICD-10-CM | POA: Diagnosis not present

## 2024-03-08 DIAGNOSIS — R7303 Prediabetes: Secondary | ICD-10-CM | POA: Diagnosis not present

## 2024-03-08 DIAGNOSIS — E782 Mixed hyperlipidemia: Secondary | ICD-10-CM | POA: Diagnosis not present

## 2024-03-08 DIAGNOSIS — M810 Age-related osteoporosis without current pathological fracture: Secondary | ICD-10-CM | POA: Diagnosis not present

## 2024-04-07 DIAGNOSIS — H402233 Chronic angle-closure glaucoma, bilateral, severe stage: Secondary | ICD-10-CM | POA: Diagnosis not present

## 2024-04-15 DIAGNOSIS — H532 Diplopia: Secondary | ICD-10-CM | POA: Diagnosis not present

## 2024-04-22 DIAGNOSIS — H401133 Primary open-angle glaucoma, bilateral, severe stage: Secondary | ICD-10-CM | POA: Diagnosis not present

## 2024-04-22 DIAGNOSIS — H532 Diplopia: Secondary | ICD-10-CM | POA: Diagnosis not present

## 2024-05-07 ENCOUNTER — Other Ambulatory Visit: Payer: Medicare Other

## 2024-05-12 DIAGNOSIS — R350 Frequency of micturition: Secondary | ICD-10-CM | POA: Diagnosis not present

## 2024-05-24 DIAGNOSIS — H402223 Chronic angle-closure glaucoma, left eye, severe stage: Secondary | ICD-10-CM | POA: Diagnosis not present

## 2024-05-24 DIAGNOSIS — H402213 Chronic angle-closure glaucoma, right eye, severe stage: Secondary | ICD-10-CM | POA: Diagnosis not present

## 2024-06-01 ENCOUNTER — Ambulatory Visit (HOSPITAL_BASED_OUTPATIENT_CLINIC_OR_DEPARTMENT_OTHER)
Admission: RE | Admit: 2024-06-01 | Discharge: 2024-06-01 | Disposition: A | Discharge status: Home / Self Care | Source: Ambulatory Visit | Attending: Nurse Practitioner | Admitting: Nurse Practitioner

## 2024-06-01 DIAGNOSIS — M8589 Other specified disorders of bone density and structure, multiple sites: Secondary | ICD-10-CM | POA: Diagnosis not present

## 2024-06-01 DIAGNOSIS — Z13820 Encounter for screening for osteoporosis: Secondary | ICD-10-CM | POA: Diagnosis not present

## 2024-06-01 DIAGNOSIS — M810 Age-related osteoporosis without current pathological fracture: Secondary | ICD-10-CM | POA: Diagnosis not present

## 2024-06-28 DIAGNOSIS — H402213 Chronic angle-closure glaucoma, right eye, severe stage: Secondary | ICD-10-CM | POA: Diagnosis not present

## 2024-06-28 DIAGNOSIS — H402223 Chronic angle-closure glaucoma, left eye, severe stage: Secondary | ICD-10-CM | POA: Diagnosis not present

## 2024-07-07 DIAGNOSIS — M1990 Unspecified osteoarthritis, unspecified site: Secondary | ICD-10-CM | POA: Diagnosis not present

## 2024-07-07 DIAGNOSIS — I2510 Atherosclerotic heart disease of native coronary artery without angina pectoris: Secondary | ICD-10-CM | POA: Diagnosis not present

## 2024-07-07 DIAGNOSIS — I700 Atherosclerosis of aorta: Secondary | ICD-10-CM | POA: Diagnosis not present

## 2024-07-07 DIAGNOSIS — E039 Hypothyroidism, unspecified: Secondary | ICD-10-CM | POA: Diagnosis not present

## 2024-07-07 DIAGNOSIS — E559 Vitamin D deficiency, unspecified: Secondary | ICD-10-CM | POA: Diagnosis not present

## 2024-07-07 DIAGNOSIS — M810 Age-related osteoporosis without current pathological fracture: Secondary | ICD-10-CM | POA: Diagnosis not present

## 2024-07-07 DIAGNOSIS — R7303 Prediabetes: Secondary | ICD-10-CM | POA: Diagnosis not present

## 2024-07-07 DIAGNOSIS — E230 Hypopituitarism: Secondary | ICD-10-CM | POA: Diagnosis not present

## 2024-08-10 DIAGNOSIS — M1990 Unspecified osteoarthritis, unspecified site: Secondary | ICD-10-CM | POA: Diagnosis not present

## 2024-08-10 DIAGNOSIS — M5030 Other cervical disc degeneration, unspecified cervical region: Secondary | ICD-10-CM | POA: Diagnosis not present

## 2024-08-10 DIAGNOSIS — E782 Mixed hyperlipidemia: Secondary | ICD-10-CM | POA: Diagnosis not present

## 2024-08-10 DIAGNOSIS — E039 Hypothyroidism, unspecified: Secondary | ICD-10-CM | POA: Diagnosis not present

## 2024-08-10 DIAGNOSIS — R001 Bradycardia, unspecified: Secondary | ICD-10-CM | POA: Diagnosis not present

## 2024-08-10 DIAGNOSIS — Z0000 Encounter for general adult medical examination without abnormal findings: Secondary | ICD-10-CM | POA: Diagnosis not present

## 2024-08-10 DIAGNOSIS — E559 Vitamin D deficiency, unspecified: Secondary | ICD-10-CM | POA: Diagnosis not present

## 2024-08-10 DIAGNOSIS — Z23 Encounter for immunization: Secondary | ICD-10-CM | POA: Diagnosis not present

## 2024-08-10 DIAGNOSIS — R7303 Prediabetes: Secondary | ICD-10-CM | POA: Diagnosis not present

## 2024-08-10 DIAGNOSIS — I2510 Atherosclerotic heart disease of native coronary artery without angina pectoris: Secondary | ICD-10-CM | POA: Diagnosis not present

## 2024-08-10 DIAGNOSIS — M810 Age-related osteoporosis without current pathological fracture: Secondary | ICD-10-CM | POA: Diagnosis not present

## 2024-09-28 NOTE — Progress Notes (Unsigned)
 Cardiology Office Note:  .   Date:  09/29/2024  ID:  Stephen Garcia, DOB 07-02-1941, MRN [id number] PCP: Seabron Lenis, MD  [facility] Providers Cardiologist:  Darryle ONEIDA Decent, MD Cardiology APP:  Vicci Rollo SAUNDERS, PA-C { History of Present Illness: .    Chief Complaint  Patient presents with   Follow-up    Stephen Garcia is a 83 y.o. male with history of CAD who presents for follow-up.   History of Present Illness   Stephen Garcia is an 83 year old male with non-STEMI, CAD, and hyperlipidemia who presents for follow-up.  He has been experiencing non-cardiac chest discomfort, described as 'hurting down a little bit every night,' which has recently started to subside. There is no pressure or sensation of someone pushing on him. He has a history of a non-STEMI in October 2024 and has been on Brilinta  and aspirin  since then. He is currently taking aspirin  and was on Brilinta  until recently.  His cholesterol was checked in September during a physical, and he was informed that it was 'a little bit better.' He is awaiting updated lipid panel results from his primary care physician.  He reports losing about 10 to 11 pounds unintentionally since his last visit, which was noted by his thyroid  doctor. He was not actively trying to lose weight but has had his suits altered to fit his new size.  He is not diabetic, does not smoke, and his blood pressure has been good. He used to be more physically active, playing softball and walking frequently, but has reduced his activity level as he has aged. Recently, he walked three miles to a store and back, which he found surprising but manageable. He drinks plenty of water, especially before bed, and enjoys soda.  No dizziness or lightheadedness. Reports non-cardiac chest discomfort.          Problem List CAD  -NSTEMI 08/2023 -PCI mRCA and D1 09/01/2023 2. HLD -T chol 139, HDL 53, LDL 80, TG 31 3. Chronic angle closure glaucoma     ROS: All  other ROS reviewed and negative. Pertinent positives noted in the HPI.     Studies Reviewed: S[facility]RA       [facility] 09/01/2023 Conclusions: Multivessel coronary artery disease, as detailed below.  Potential culprit lesions for the patient NSTEMI were felt to be 99% mid RCA and 90% D1 stenoses. Normal left ventricular filling pressure (LVEDP 12 mmHg). Successful PCI to mid RCA using Synergy 2.5 x 24 mm drug-eluting stent with 0% residual stenosis and TIMI-3 flow. Successful PCI to D1 using Onyx Frontier 2.0 x 15 mm drug-eluting stent with 10% residual stenosis and TIMI-3 flow.   Recommendations: Continue cangrelor  infusion for 2 hours from time of ticagrelor  load.  Dual antiplatelet therapy with aspirin  and ticagrelor  for at least 12 months (if symptomatic bradycardia is a problem despite addressing other reversible causes like timolol  use, transition from ticagrelor  to clopidogrel may need to be considered). Aggressive secondary prevention of coronary artery disease. Medical management of distal LAD, ramus intermedius, and rPDA disease. Physical Exam:   VS:  BP (!) 110/40   Pulse (!) 57   Ht 5' 4 (1.626 m)   Wt 160 lb (72.6 kg)   SpO2 98%   BMI 27.46 kg/m    Wt Readings from Last 3 Encounters:  09/29/24 160 lb (72.6 kg)  10/14/23 168 lb (76.2 kg)  09/09/23 171 lb (77.6 kg)    GEN: Well nourished, well developed in  no acute distress NECK: No JVD; No carotid bruits CARDIAC: RRR, no murmurs, rubs, gallops RESPIRATORY:  Clear to auscultation without rales, wheezing or rhonchi  ABDOMEN: Soft, non-tender, non-distended EXTREMITIES:  No edema; No deformity  ASSESSMENT AND PLAN: .   Assessment and Plan    Atherosclerotic heart disease of native coronary artery, status post PCI, without angina Status post PCI to RCA and first diagonal. Distal disease in LAD, Ramus, RPDA. Completed one year of DAPT. Non-cardiac chest discomfort has been intermittently improving. Good heart function last year. -  Discontinued Brilinta . - Continue aspirin . - Encouraged regular exercise, avoiding extreme weather conditions. - Ensure adequate hydration.  Mixed hyperlipidemia Cholesterol levels improved in September. Continues on cholesterol medication. - Obtain updated lipid panel from primary care physician. - Continue current cholesterol medication.  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 No symptoms of dizziness or lightheadedness. - Continue current management.              Follow-up: Return in about 1 year (around 09/29/2025).  Signed, Darryle DASEN. Barbaraann, MD, [facility]  [address] [place], [place] [address] [phone]  3:31 PM

## 2024-09-29 ENCOUNTER — Ambulatory Visit: Attending: Cardiovascular Disease | Admitting: Cardiovascular Disease

## 2024-09-29 ENCOUNTER — Encounter: Payer: Self-pay | Admitting: Cardiovascular Disease

## 2024-09-29 VITALS — BP 110/40 | HR 57 | Ht 64.0 in | Wt 160.0 lb

## 2024-09-29 DIAGNOSIS — I2510 Atherosclerotic heart disease of native coronary artery without angina pectoris: Secondary | ICD-10-CM

## 2024-09-29 DIAGNOSIS — E782 Mixed hyperlipidemia: Secondary | ICD-10-CM | POA: Diagnosis not present

## 2024-09-29 DIAGNOSIS — R001 Bradycardia, unspecified: Secondary | ICD-10-CM

## 2024-09-29 NOTE — Patient Instructions (Addendum)
 Medication Instructions:  Stop Brilinta  90 mcg as directed  *If you need a refill on your cardiac medications before your next appointment, please call your pharmacy*  Lab Work: NONE ordered at this time of appointment   Testing/Procedures: NONE ordered at this time of appointment   Follow-Up: At [facility], you and your health needs are our priority.  As part of our continuing mission to provide you with exceptional heart care, our providers are all part of one team.  This team includes your primary Cardiologist (physician) and Advanced Practice Providers or APPs (Physician Assistants and Nurse Practitioners) who all work together to provide you with the care you need, when you need it.  Your next appointment:   1 year(s)  Provider:   Any APP    We recommend signing up for the patient portal called MyChart.  Sign up information is provided on this After Visit Summary.  MyChart is used to connect with patients for Virtual Visits (Telemedicine).  Patients are able to view lab/test results, encounter notes, upcoming appointments, etc.  Non-urgent messages can be sent to your provider as well.   To learn more about what you can do with MyChart, go to [email or web address].

## 2024-10-11 ENCOUNTER — Ambulatory Visit: Admitting: Cardiovascular Disease
